# Patient Record
Sex: Female | Born: 1945
Health system: Southern US, Community
[De-identification: ages and names within clinical notes are randomized; demographics above are authoritative.]

## PROBLEM LIST (undated history)

## (undated) ENCOUNTER — Ambulatory Visit

## (undated) DIAGNOSIS — M1990 Unspecified osteoarthritis, unspecified site: Secondary | ICD-10-CM

## (undated) DIAGNOSIS — T4145XA Adverse effect of unspecified anesthetic, initial encounter: Secondary | ICD-10-CM

## (undated) DIAGNOSIS — N390 Urinary tract infection, site not specified: Secondary | ICD-10-CM

## (undated) DIAGNOSIS — D649 Anemia, unspecified: Secondary | ICD-10-CM

## (undated) DIAGNOSIS — K222 Esophageal obstruction: Secondary | ICD-10-CM

## (undated) DIAGNOSIS — I10 Essential (primary) hypertension: Secondary | ICD-10-CM

## (undated) DIAGNOSIS — G4730 Sleep apnea, unspecified: Secondary | ICD-10-CM

## (undated) DIAGNOSIS — F419 Anxiety disorder, unspecified: Secondary | ICD-10-CM

## (undated) DIAGNOSIS — T8859XA Other complications of anesthesia, initial encounter: Secondary | ICD-10-CM

## (undated) DIAGNOSIS — K219 Gastro-esophageal reflux disease without esophagitis: Secondary | ICD-10-CM

## (undated) DIAGNOSIS — R002 Palpitations: Secondary | ICD-10-CM

## (undated) HISTORY — PX: TUBAL LIGATION: SHX77

## (undated) HISTORY — DX: Anemia, unspecified: D64.9

## (undated) HISTORY — PX: DILATION AND CURETTAGE OF UTERUS: SHX78

## (undated) HISTORY — DX: Gastro-esophageal reflux disease without esophagitis: K21.9

## (undated) HISTORY — PX: OTHER SURGICAL HISTORY: SHX169

## (undated) HISTORY — PX: ESOPHAGOGASTRODUODENOSCOPY: SHX1529

## (undated) HISTORY — DX: Urinary tract infection, site not specified: N39.0

## (undated) HISTORY — DX: Essential (primary) hypertension: I10

## (undated) HISTORY — PX: CHOLECYSTECTOMY: SHX55

## (undated) HISTORY — DX: Esophageal obstruction: K22.2

---

## 1999-05-30 ENCOUNTER — Encounter: Payer: Self-pay | Admitting: Gastroenterology

## 1999-05-30 ENCOUNTER — Ambulatory Visit (HOSPITAL_COMMUNITY): Admission: RE | Admit: 1999-05-30 | Discharge: 1999-05-30 | Payer: Self-pay | Admitting: Gastroenterology

## 1999-07-11 ENCOUNTER — Other Ambulatory Visit: Admission: RE | Admit: 1999-07-11 | Discharge: 1999-07-11 | Payer: Self-pay | Admitting: *Deleted

## 1999-07-18 ENCOUNTER — Encounter: Admission: RE | Admit: 1999-07-18 | Discharge: 1999-10-16 | Payer: Self-pay | Admitting: Internal Medicine

## 1999-09-30 ENCOUNTER — Encounter: Payer: Self-pay | Admitting: Occupational Medicine

## 1999-09-30 ENCOUNTER — Ambulatory Visit: Admission: RE | Admit: 1999-09-30 | Discharge: 1999-09-30 | Payer: Self-pay | Admitting: Occupational Medicine

## 1999-10-14 ENCOUNTER — Emergency Department (HOSPITAL_COMMUNITY): Admission: EM | Admit: 1999-10-14 | Discharge: 1999-10-14 | Payer: Self-pay | Admitting: Emergency Medicine

## 1999-10-15 ENCOUNTER — Encounter: Payer: Self-pay | Admitting: Emergency Medicine

## 2000-03-11 ENCOUNTER — Emergency Department (HOSPITAL_COMMUNITY): Admission: EM | Admit: 2000-03-11 | Discharge: 2000-03-11 | Payer: Self-pay | Admitting: Emergency Medicine

## 2001-03-11 ENCOUNTER — Encounter (INDEPENDENT_AMBULATORY_CARE_PROVIDER_SITE_OTHER): Payer: Self-pay | Admitting: *Deleted

## 2001-03-11 ENCOUNTER — Other Ambulatory Visit: Admission: RE | Admit: 2001-03-11 | Discharge: 2001-03-11 | Payer: Self-pay | Admitting: *Deleted

## 2002-01-23 ENCOUNTER — Encounter: Admission: RE | Admit: 2002-01-23 | Discharge: 2002-01-23 | Payer: Self-pay | Admitting: Internal Medicine

## 2002-01-23 ENCOUNTER — Encounter: Payer: Self-pay | Admitting: Internal Medicine

## 2002-03-31 ENCOUNTER — Emergency Department (HOSPITAL_COMMUNITY): Admission: EM | Admit: 2002-03-31 | Discharge: 2002-03-31 | Payer: Self-pay | Admitting: Emergency Medicine

## 2002-04-24 ENCOUNTER — Other Ambulatory Visit: Admission: RE | Admit: 2002-04-24 | Discharge: 2002-04-24 | Payer: Self-pay | Admitting: *Deleted

## 2002-05-08 ENCOUNTER — Encounter: Admission: RE | Admit: 2002-05-08 | Discharge: 2002-08-06 | Payer: Self-pay | Admitting: Internal Medicine

## 2003-05-30 ENCOUNTER — Emergency Department (HOSPITAL_COMMUNITY): Admission: EM | Admit: 2003-05-30 | Discharge: 2003-05-30 | Payer: Self-pay | Admitting: Emergency Medicine

## 2003-05-30 ENCOUNTER — Encounter: Payer: Self-pay | Admitting: Emergency Medicine

## 2003-06-09 ENCOUNTER — Encounter: Payer: Self-pay | Admitting: Orthopedic Surgery

## 2003-06-11 ENCOUNTER — Inpatient Hospital Stay (HOSPITAL_COMMUNITY): Admission: RE | Admit: 2003-06-11 | Discharge: 2003-06-14 | Payer: Self-pay | Admitting: Orthopedic Surgery

## 2004-03-11 ENCOUNTER — Ambulatory Visit (HOSPITAL_BASED_OUTPATIENT_CLINIC_OR_DEPARTMENT_OTHER): Admission: RE | Admit: 2004-03-11 | Discharge: 2004-03-11 | Payer: Self-pay | Admitting: Pulmonary Disease

## 2004-05-19 ENCOUNTER — Other Ambulatory Visit: Admission: RE | Admit: 2004-05-19 | Discharge: 2004-05-19 | Payer: Self-pay | Admitting: *Deleted

## 2004-10-11 ENCOUNTER — Ambulatory Visit: Payer: Self-pay | Admitting: Gastroenterology

## 2004-11-15 ENCOUNTER — Ambulatory Visit: Payer: Self-pay | Admitting: Gastroenterology

## 2005-01-25 ENCOUNTER — Ambulatory Visit: Payer: Self-pay | Admitting: Internal Medicine

## 2005-04-10 ENCOUNTER — Ambulatory Visit: Payer: Self-pay | Admitting: Internal Medicine

## 2005-05-15 ENCOUNTER — Ambulatory Visit: Payer: Self-pay | Admitting: Internal Medicine

## 2005-05-22 ENCOUNTER — Ambulatory Visit: Payer: Self-pay | Admitting: Internal Medicine

## 2005-05-30 ENCOUNTER — Ambulatory Visit: Payer: Self-pay | Admitting: Pulmonary Disease

## 2006-07-26 ENCOUNTER — Other Ambulatory Visit: Admission: RE | Admit: 2006-07-26 | Discharge: 2006-07-26 | Payer: Self-pay | Admitting: *Deleted

## 2006-07-26 ENCOUNTER — Ambulatory Visit: Payer: Self-pay | Admitting: Internal Medicine

## 2006-08-13 ENCOUNTER — Ambulatory Visit: Payer: Self-pay | Admitting: Internal Medicine

## 2006-08-27 ENCOUNTER — Ambulatory Visit: Payer: Self-pay | Admitting: Internal Medicine

## 2006-12-05 ENCOUNTER — Ambulatory Visit: Payer: Self-pay | Admitting: Internal Medicine

## 2006-12-05 ENCOUNTER — Inpatient Hospital Stay (HOSPITAL_COMMUNITY): Admission: EM | Admit: 2006-12-05 | Discharge: 2006-12-08 | Payer: Self-pay | Admitting: Emergency Medicine

## 2006-12-08 ENCOUNTER — Ambulatory Visit: Payer: Self-pay | Admitting: Internal Medicine

## 2006-12-26 ENCOUNTER — Ambulatory Visit: Payer: Self-pay | Admitting: Internal Medicine

## 2007-05-31 ENCOUNTER — Encounter: Admission: RE | Admit: 2007-05-31 | Discharge: 2007-05-31 | Payer: Self-pay | Admitting: Gastroenterology

## 2007-06-08 ENCOUNTER — Observation Stay (HOSPITAL_COMMUNITY): Admission: EM | Admit: 2007-06-08 | Discharge: 2007-06-09 | Payer: Self-pay | Admitting: Emergency Medicine

## 2007-06-08 ENCOUNTER — Ambulatory Visit: Payer: Self-pay | Admitting: Internal Medicine

## 2007-11-18 ENCOUNTER — Telehealth: Payer: Self-pay | Admitting: Internal Medicine

## 2008-09-18 ENCOUNTER — Telehealth: Payer: Self-pay | Admitting: Internal Medicine

## 2008-10-05 ENCOUNTER — Telehealth: Payer: Self-pay | Admitting: Internal Medicine

## 2008-10-26 ENCOUNTER — Telehealth: Payer: Self-pay | Admitting: Internal Medicine

## 2009-03-01 ENCOUNTER — Other Ambulatory Visit: Admission: RE | Admit: 2009-03-01 | Discharge: 2009-03-01 | Payer: Self-pay | Admitting: Obstetrics and Gynecology

## 2010-05-04 ENCOUNTER — Other Ambulatory Visit: Admission: RE | Admit: 2010-05-04 | Discharge: 2010-05-04 | Payer: Self-pay | Admitting: Obstetrics and Gynecology

## 2010-09-22 ENCOUNTER — Ambulatory Visit (HOSPITAL_COMMUNITY): Admission: RE | Admit: 2010-09-22 | Discharge: 2010-09-22 | Payer: Self-pay | Admitting: Gastroenterology

## 2010-10-04 ENCOUNTER — Ambulatory Visit (HOSPITAL_COMMUNITY)
Admission: RE | Admit: 2010-10-04 | Discharge: 2010-10-04 | Payer: Self-pay | Source: Home / Self Care | Attending: Gastroenterology | Admitting: Gastroenterology

## 2010-11-13 ENCOUNTER — Encounter: Payer: Self-pay | Admitting: Gastroenterology

## 2011-03-07 NOTE — H&P (Signed)
[name], [name] NO.:  [id number]   MEDICAL RECORD NO.:  [id number]          PATIENT TYPE:  EMS   LOCATION:  MAJO                         FACILITY:  [facility]   PHYSICIAN:  Corwin Levins, MD      DATE OF BIRTH:  Jul 07, 1946   DATE OF ADMISSION:  06/08/2007  DATE OF DISCHARGE:                              HISTORY & PHYSICAL   CHIEF COMPLAINT:  Since 11:00 p.m. August 15 with headache, nausea,  vomiting, abdominal pains and diarrhea.   HISTORY OF PRESENT ILLNESS:  [name] is a 65 year old African-  American female here with the above symptoms now prostrate and weak and  quite nervous; unable to go home after treatment in the ER with Zofran,  Fentanyl and IV fluids.  She is not particularly dizzy, there are no  falls or loss of consciousness.  She denies any blood either hematemesis  or bright red blood per rectum.  There had been multiple chills.  She  denies any GU symptoms.  Her main pain has to do with the headache and  epigastric discomfort.  This is all somewhat similar to previous  admission February of 2008 where she was treated symptomatically and no  definite diagnosis made.  Upper GI series May 31, 2007 with reflux  only, exam otherwise negative.   PAST MEDICAL HISTORY/ILLNESSES:  1. Syncope.  2. History of gastroparesis.  3. Diabetes mellitus.  4. GERD.  5. Morbid obesity.  6. Diverticulosis.  7. Depression/anxiety.  8. Status post right ACL repair.  9. Status post C-section.  10.History of D&C.  11.History of hernia repair.  12.Status post cholecystectomy.   ALLERGIES:  No known drug allergies.   CURRENT MEDICATIONS:  1. Prevacid 30 mg p.o. q.day.  2. Multivitamin one p.o. q.day.  3. Prozac 20 mg p.o. q.day.  4. Januvia 50 mg p.o. q.day.  5. Toprol XL, unknown dose, one q.day.   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   FAMILY  HISTORY:  Diabetes mellitus.   PHYSICAL EXAMINATION:  VITAL SIGNS:  Temperature 99.5, blood pressure  148/99, heart rate 101, respirations 26, O2 saturation 99%.  GENERAL:  She is prostrate, markedly anxious complaining of abdominal  discomfort and headache.  HEENT EXAM:  Sclerae clear.  TMs clear.  Oropharynx benign.  NECK:  Without lymphadenopathy, JVD or thyromegaly.  CHEST:  No rales or wheezing.  CARDIAC EXAM:  Regular rate and rhythm.  No murmur.  ABDOMEN:  Soft.  Epigastric tenderness moderate.  No guarding.  No  rebound.  EXTREMITIES:  No edema.   LABS:  C-Met within normal limits.  White blood cell count 8.6,  hemoglobin 12.9.   ASSESSMENT/PLAN:  1. Headache with fever, nausea, vomiting, epigastric pain and loose      stools.  Differential includes food poisoning versus viral      gastroenteritis versus other etiologies that seem less likely such      as gastroparesis flare, pancreatitis, bacterial colitis versus      other.  She is to be admitted.  Will check a lipase, urinalysis,      culture and sensitivity, and portable chest x-ray for completeness.      She will start with clear liquids and IV fluids, as well as symptom      control with Zofran and Dilaudid IV.  Check stool studies, guaiac      stools.  Trend the vitals.  Continue with a proton pump inhibitor      in the IV form.  2. Diabetes mellitus.  Check CBGs, sliding-scale insulin, hold the      oral hypoglycemic agent.  3. Hypertension.  Try to give Toprol XL p.o., dose not clear, try 50      mg.  4. Anxiety.  Rather significant component and will give lorazepam IV      p.r.n.  5. Full code status.  6. Prophylaxis.  Give Lovenox subcutaneously q.day.  7. Disposition for home when improved.      Corwin Levins, MD  Electronically Signed     JWJ/MEDQ  D:  06/08/2007  T:  06/08/2007  Job:  [id number]   cc:   Deirdre Peer. Polite, M.D.  [facility]

## 2011-03-10 NOTE — Discharge Summary (Signed)
[name], [name]              ACCOUNT NO.:  [id number]   MEDICAL RECORD NO.:  [id number]          PATIENT TYPE:  INP   LOCATION:  5148                         FACILITY:  [facility]   PHYSICIAN:  Georgann Housekeeper, MD      DATE OF BIRTH:  August 29, 1946   DATE OF ADMISSION:  06/08/2007  DATE OF DISCHARGE:  06/09/2007                               DISCHARGE SUMMARY   DISCHARGE DIAGNOSES:  1. Acute gastroenteritis.  2. Nausea and vomiting, secondary to gastroenteritis.  3. Mild hypokalemia.  4. History of diabetes.  5. History of hypertension.   DISCHARGE MEDICATIONS:  1. Januvia 100 mg p.o. daily.  2. Prevacid 30 mg daily.  3. Prozac 20 mg daily.  4. Phenergan p.r.n. for nausea.   DISCHARGE INSTRUCTIONS:  Bland diet and follow with Dr. Renford Dills in  one week.   HOSPITAL COURSE:  The patient is 65 years old with a history of acid  reflux disease and diabetes, morbid obesity and diverticulosis and  depression who came in with nausea and vomiting, epigastric pain.  The  patient was admitted.  Her lab data all negative, lipase and blood  chemistries were okay, mild hypokalemia.  She had had gallbladder  surgery in the past.  Was put on IV fluids and clear liquid diet and  patient tolerated that well.  No evidence of any nausea and vomiting.  Her sugars remained stable.  She had mild hypokalemia and potassium was  replaced.  Her renal function and white count were normal.  Stool  studies were sent which came back negative for C. diff.  The patient had  no more diarrhea.  Diet was advanced to liquids and tolerated that well.  The patient was discharged with bland diet instructions and follow-up  with Dr. Renford Dills in one week.      Georgann Housekeeper, MD  Electronically Signed     KH/MEDQ  D:  07/29/2007  T:  07/29/2007  Job:  [id number]

## 2011-03-10 NOTE — H&P (Signed)
[name], [name]              ACCOUNT NO.:  [id number]   MEDICAL RECORD NO.:  [id number]          PATIENT TYPE:  INP   LOCATION:  1430                         FACILITY:  [facility]   PHYSICIAN:  Georgina Quint. Plotnikov, MDDATE OF BIRTH:  [birth year]   DATE OF ADMISSION:  12/05/2006  DATE OF DISCHARGE:                              HISTORY & PHYSICAL   CHIEF COMPLAINT:  Fever, weakness.   HISTORY OF PRESENT ILLNESS:  The patient is a 65 year old female who has  been sick with flu-like symptoms for the past two days.  Presented to  the office today with her husband with sore throat, fever, chills and  weakness.  She was also nauseated starting this morning.  When I walked  in, she was sitting on the chair, slumped over, staring at the wall.  She was very slow to respond to questions.  Her color was somewhat  ashen.  Myself and two nurses got her up and moved her to the exam table  with great difficulty.  She has some difficulty following commands,  although answered questions appropriately.  She was laid down and the  ambulance was called to transfer to [facility] ER.   PAST MEDICAL HISTORY:  1. Gastroparesis.  2. Diabetes type 2.  3. GERD.  4. Morbid obesity.  5. Diverticulosis.  6. Hypertension.  7. Depression.   CURRENT MEDICATIONS:  Unable to obtain from the patient at present.   ALLERGIES:  No known drug allergies.   SOCIAL HISTORY:  She is a nonsmoker, nondrinker.  Husband is with her.   FAMILY HISTORY:  Positive for diabetes.   REVIEW OF SYSTEMS:  Unobtainable from the patient.  The husband is  describing general flu-like illness.  Also states now she looks much  worse than she did a few minutes ago.  The rest of the 10-point review  of systems is unobtainable.   PHYSICAL EXAMINATION:  Blood pressure 123/74, pulse 96, respirations  103, temperature 102.6, weight 218 pounds.  O2 saturation 96%.  Blood  sugar 129.  She is in moderate acute distress.  HEENT with  erythematous  mucosa.  Throat with erythema.  Lungs clear.  No wheezes.  Heart is S1-  S2.  Distant tones, tachycardia.  Abdomen is soft and nontender.  No  organomegaly, no mass felt.  Lower extremities without edema.  She is  alert, cooperative.  Meningeal signs are negative.  Calves are  nontender.  Cranial nerves II through XII grossly nonfocal.  Balance is  disturbed.  Skin without rashes, ashen.   ASSESSMENT/PLAN:  1. Near syncopal episode, partially unresponsive on exam:  Most likely      this spell is vasovagal; however, we need to rule out cardiac and      other causes.  She is obviously quite sick with febrile illness.      We will treat with IV fluids, transfer to [facility] via      ambulance.  2. Upper respiratory tract infection, possible influenza:  Obtain a      rapid flu test.  We will use Tamiflu is positive.  Otherwise, we      will use empiric Rocephin.  3. Headache without meningeal signs:  It would be suspicious obtain a      CT of the head without contrast to rule out bleeding and other      causes.  We will need a chest x-ray.  4. Fever:  Obtain blood cultures, flu test (she has had a flu      vaccination this season).  5. Chronic gastroparesis:  We will keep n.p.o. for now.  6. Type 2 diabetes:  We will use sliding scale.      Georgina Quint. Plotnikov, MD  Electronically Signed     AVP/MEDQ  D:  12/05/2006  T:  12/06/2006  Job:  [id number]

## 2011-03-10 NOTE — Discharge Summary (Signed)
NAME:  [name], [name]                        ACCOUNT NO.:  [id number]   MEDICAL RECORD NO.:  [id number]                   PATIENT TYPE:  INP   LOCATION:  0468                                 FACILITY:  [facility]   PHYSICIAN:  Vania Rea. Supple, M.D.               DATE OF BIRTH:  [birth year]   DATE OF ADMISSION:  06/11/2003  DATE OF DISCHARGE:  06/14/2003                                 DISCHARGE SUMMARY   ADMISSION DIAGNOSES:  1. Status post right knee dislocation with multi ligamentous injury and     peroneal nerve palsy.  2. Hypertension.  3. Anxiety.  4. Non-insulin-dependent diabetes.  5. Gastroesophageal reflux disease.   DISCHARGE DIAGNOSES:  1. Status post right knee dislocation with multi ligamentous injury and     peroneal nerve palsy.  2. Status post Allograft ACL reconstruction with open lateral collateral     ligament, posterolateral corner repair, lateral meniscal repair with     exploration, neurolysis of the peroneal nerve.   OPERATION:  Surgeon Vania Rea. Supple, M.D.  Assistant French Ana A. Shuford, P.A.-  C.  Under general anesthesia.   BRIEF HISTORY:  [name] is a 65 year old female who was running trying to  get away from a dog.  Sustained a significant knee injury with a dislocation  noted.  She was seen and evaluated on an outpatient basis.  An MRI was  obtained which did confirm multi ligamentous injury with ACL rupture.  The  patient had gross instability on examination.  She was found to have a  complete peroneal nerve palsy also on examination.  With these findings it  is felt that she should undergo surgical fixation as above.  Risks, benefits  discussed with patient and she is in agreement with procedure.   HOSPITAL COURSE:  The patient is admitted.  Underwent above named procedure  and tolerated this well.  All appropriate IV antibiotics, analgesics were  utilized.  Postoperatively, patient was placed partial weightbearing in a  knee immobilizer at 50%  with no range of motion initially.  She was placed  on AFOs to prevent Achilles contractures.  All appropriate IV antibiotics,  analgesics were utilized.  She was placed on Coumadin for DVT and PE  prophylaxis due to body habitus and decrease of activity.  All in all  patient did well postoperatively.  Home needs were arranged.  She progressed  well, tolerating short distance ambulation and was doing well with  transfers.  She was maintained on just p.o.'s for pain control.  By date  June 14, 2003 she was felt orthopedically and medically stable to be  discharged to home.  Arrangements were made and she was discharged home to  follow up on an outpatient basis.   LABORATORY DATA:  Admission hemoglobin 11.9, postoperatively 10.  Multiple  pro times found in the chart.  Followed by pharmacy on Coumadin.  Chemistries on admission within normal  limits.  Postoperatively normal also  except for 127 and 123 glucose.  Urinalysis was negative on admission.  EKG  showed normal sinus rhythm.  Chest x-ray showed scoliosis, but no active  disease.   CONDITION ON DISCHARGE:  Stable, improved.   DISCHARGE MEDICATIONS AND PLANS:  The patient will be discharged to home.  She will follow up two weeks.  Call for time.  Home health RN, PT, OT, and  aide are arranged.  She is allowed 50% partial weightbearing  with crutches and her knee immobilizer on.  May shower at five days  postoperatively.  No range of motion to the knee at this time.  Prescription  is provided for Percocet one to two q.4-6h. p.r.n. pain and Coumadin.  Resume home medications, home diet.     Tracy A. Shuford, P.A.-C.                 Vania Rea. Supple, M.D.    TAS/MEDQ  D:  07/09/2003  T:  07/09/2003  Job:  [id number]

## 2011-03-10 NOTE — H&P (Signed)
NAME:  [name], [name]                        ACCOUNT NO.:  [id number]   MEDICAL RECORD NO.:  [id number]                   PATIENT TYPE:  INP   LOCATION:  0468                                 FACILITY:  [facility]   PHYSICIAN:  Vania Rea. Supple, M.D.               DATE OF BIRTH:  09/27/1946   DATE OF ADMISSION:  06/11/2003  DATE OF DISCHARGE:  06/14/2003                                HISTORY & PHYSICAL   CHIEF COMPLAINT:  Right knee pain.   HISTORY OF PRESENT ILLNESS:  [name] is a 65 year old female who was  trying to run to get away from a dog sustaining a right knee dislocation.  She was initially seen and evaluated outpatient and was found to have  multiple ligamentous injuries.  She was sent for MRI.  She was also found to  have a peroneal nerve palsy post dislocation.  MRI demonstrated rupture of  the ACL and complete rupture of the posterolateral corner, lateral  collateral ligament, and biceps femoris.  She was felt to require admission  for operative fixation of above along with decompression of the peroneal  nerve.  Risks/benefits discussed with patient at length and she wished to  proceed.   PAST MEDICAL HISTORY:  1. Hypertension.  2. History of diverticulitis.  3. Non-insulin-dependent diabetes.  4. Reflux.   PAST SURGICAL HISTORY:  1. D&C.  2. Cholecystectomy.  3. Hernia repair as a child.  4. Cesarean section.   CURRENT MEDICATIONS:  1. Aciphex.  2. Reglan.  3. Actos.  4. Toprol.  5. Bextra.  6. Methocarbamol.  7. Percocet.   ALLERGIES:  No known drug allergies.   SOCIAL HISTORY:  She lives at home with family.  She quit smoking  approximately eight years ago.  She does not drink.   REVIEW OF SYSTEMS:  CONSTITUTIONAL:  Denies any recent fevers, chills, night  sweats.  No bleeding tendencies.  CNS:  No blurred/double vision, seizure,  paralysis.  RESPIRATORY:  No shortness of breath, productive cough,  hemoptysis.  CARDIOVASCULAR:  No chest pain,  angina, orthopnea.  GASTROINTESTINAL:  No nausea, vomiting, constipation, melena, bloody stools.  GENITOURINARY:  No dysuria.  MUSCULOSKELETAL:  As pertinent to present  illness.   FAMILY HISTORY:  Noncontributory.   PHYSICAL EXAMINATION:  GENERAL:  A 65 year old African-American female in  mild discomfort secondary to right knee pain.  Alert and oriented.  NECK:  Supple.  CHEST:  Clear to auscultation bilaterally.  HEART:  Regular rate and rhythm.  ABDOMEN:  Soft, nontender.  EXTREMITIES:  Right knee with gross instability on examination.  Neurovascularly she is intact except for complete peroneal nerve palsy.   IMPRESSION:  1. Status post right knee dislocation with multi ligamentous injury.  2. Peroneal nerve palsy.   PLAN:  Admit for fixation of above.  Risks/benefits discussed with patient.  She wished to proceed.     Tracy A. Shuford, P.A.-C.  Vania Rea. Supple, M.D.    TAS/MEDQ  D:  07/09/2003  T:  07/09/2003  Job:  [id number]

## 2011-03-10 NOTE — Discharge Summary (Signed)
[name], [name]              ACCOUNT NO.:  [id number]   MEDICAL RECORD NO.:  [id number]          PATIENT TYPE:  INP   LOCATION:  1430                         FACILITY:  [facility]   PHYSICIAN:  Rosalyn Gess. Norins, MD  DATE OF BIRTH:  Oct 18, 1946   DATE OF ADMISSION:  12/05/2006  DATE OF DISCHARGE:  12/08/2006                               DISCHARGE SUMMARY   ADMITTING DIAGNOSES:  1. Syncope.  2. Upper respiratory infection.  3. Headache  4. Gastroparesis.  5. Type 2 diabetes.   DISCHARGE DIAGNOSES:  1. Syncope.  2. Upper respiratory infection.  3. Headache  4. Gastroparesis.  5. Type 2 diabetes.   CONSULTANTS:  None.   PROCEDURES:  1. CT scan of the brain without contrast with no acute intracranial      abnormality.  2. Chest x-ray with no active cardiopulmonary disease.   HISTORY OF PRESENT ILLNESS:  The patient is a 65 year old woman who had  been sick with flu-like symptoms for 2 days prior to admission.  She  presented to the office on the day of admission complaining of a sore  throat, fever, chills and weakness.  She also had nausea.  She was found  in the exam room slumped over, staring at the wall, very slow to respond  with ashen color.  She was difficult to remove and arouse.  It was felt  she had had a near syncopal episode with possible hypoxemia.  She was  transported to [facility].   Please see the admission note for past medical history, family history,  and social history.   Admission exam was significant for temperature of 102.6, respirations  were 103, pulse was 96, O2 saturation was 96%, blood sugar was 129.  The  patient was in moderate distress.  She had grayish mucosa, throat with erythema.  ABDOMEN:  Was soft.  LUNGS:  Were clear.  NEUROLOGIC EXAM:  Was nonfocal.   HOSPITAL COURSE:  The patient was admitted to the hospital.  Diagnostic  evaluation was conducted with CT scan and chest x-ray as noted.  Blood  cultures  were drawn and were negative x2.  Troponin I was negative x2.  The patient was noted to have a normal white count at 4800.  Potassium  was slightly low at 3.3.  O2 sat was 97% on room air.  The patient was  initially treated with IV antibiotics but was converted to p.o.  antibiotics on the 15th.  She continued to improve.  O2 saturations were  adequate.  The patient felt weak but in no acute distress.  With the  patient's symptoms having stabilized with her O2 saturation being normal  with her temperature returning to normal, she was felt to be stable and  ready for discharge home to continue oral antibiotics.   DISCHARGE EXAMINATION:  Temperature 99.4, blood pressure 130/91, pulse  68, respirations were 18, O2 saturations 96% on room air.  Last CBG was  141.  GENERAL APPEARANCE:  This is an obese African-American woman sitting in  bed in no acute distress.  CHEST:  Patient is moving  air well.  She had no wheezing.  CARDIOVASCULAR EXAM:  Was unremarkable.  No further examination  conducted.   FINAL LABORATORY:  CBC from February 15 with a hemoglobin of 12.4 grams,  white count was 3600, platelet count 143,000.  The patient had  differential with 83% segs, 8% lymphs, 9% monocytes.  Final chemistries  from February 15 were normal with a potassium of 3.9, creatinine 0.6,  glucose was 121.  Cardiac enzymes were negative x3 at 98, 86 and 94 with  troponin-I being negative x3 at 0.02, 0.02 and 0.02.  Calcium was normal  at 8.6.  Urinalysis was negative.  Influenza nasal swab was negative.   DISCHARGE MEDICATIONS:  The patient will resume her home medications  including:  1. Aciphex 20 mg daily.  2. Multivitamins.  3. Prozac 20 mg daily.  4. Januvia 50 mg daily.  5. Toprol XL as at home.  6. The patient will be continued on the azithromycin 500 mg p.o. daily      for an additional 2 days.  7. She will use Mucinex 2 tablets a.m. and 2 tablets p.m.   The patient may return to work on  Tuesday, February 19.  She should  follow up with Dr. Posey Rea towards the end of next week, and she will  call the office on Monday for an appointment.   The patient's condition at time of discharge dictation is stable and  improved.      Rosalyn Gess Norins, MD  Electronically Signed     MEN/MEDQ  D:  12/08/2006  T:  12/08/2006  Job:  [id number]   cc:   Georgina Quint. Plotnikov, MD  [address]  [place]  [place] [address]

## 2011-03-10 NOTE — Op Note (Signed)
NAME:  [name], [name]                        ACCOUNT NO.:  [id number]   MEDICAL RECORD NO.:  [id number]                   PATIENT TYPE:  INP   LOCATION:  X001                                 FACILITY:  [facility]   PHYSICIAN:  Vania Rea. Supple, M.D.               DATE OF BIRTH:  04-23-1946   DATE OF PROCEDURE:  06/11/2003  DATE OF DISCHARGE:                                 OPERATIVE REPORT   PREOPERATIVE DIAGNOSES:  1. Right knee dislocation with multiple ligamentous injury including 1)     right knee ACL rupture, 2) right knee lateral collateral ligament     avulsion, 3) right knee biceps femoris avulsion.  2. Perineal nerve palsy.   POSTOPERATIVE DIAGNOSES:  1. Right knee dislocation with multiple ligamentous injury including 1)     right knee ACL rupture, 2) right knee lateral collateral ligament     avulsion, 3) right knee biceps femoris avulsion.  2. Perineal nerve palsy.   PROCEDURE:  1. Right knee examination under anesthesia.  2. Right knee diagnostic arthroscopy.  3. Allograft ACL reconstruction.  4. Repair of the lateral collateral ligament.  5. Repair of the avulsed biceps femoris tendon.  6. Repair of the posterolateral corner.  7. Repair of the lateral meniscus.  8. Exploration and neurolysis of the peroneal nerve.   SURGEON:  Vania Rea. Supple, M.D.   Threasa HeadsFrench Ana A. Pacheco, P.A.-C.   ANESTHESIA:  General endotracheal.   TOURNIQUET TIME:  2 hours and 10 minutes.   ESTIMATED BLOOD LOSS:  Less than 100 mL.   DRAINS:  None.   HISTORY:  [name] is a 65 year old female who fell sustaining a low  velocity injury to her right knee with apparent severe varus deformity. She  had subsequent immediate pain, swelling, instability and inability to bear  weight. Her examination in the office showed gross varus laxity with  tenderness diffusely over the posterolateral aspect of the knee. Plain  radiographs had been obtained on the day of injury showing an  avulsion of  the fibular head with significant proximal migration consistent with an  injury to the lateral and posterolateral structures. Subsequently an MRI  scan was performed confirming a rupture of the ACL and complete rupture of  the posterolateral corner, lateral collateral ligament and the biceps  femoris. In addition, clinically she was found to have a complete peroneal  nerve palsy.   I had counseled [name] and her family members on treatment options as  well as risks versus benefits thereof. Possible surgical complications of  bleeding, infection, neurovascular injury, persistent pain, loss of motion,  recurrence of instability, posttraumatic arthritis, and possible need for  additional surgery were all reviewed. In consideration of her complex and  severe ligamentous injury, I had recommended ligamentous reconstruction as  described below and she understands and accepts and agrees with our plan.   DESCRIPTION OF PROCEDURE:  After undergoing  routine preoperative evaluation,  the patient received prophylactic antibiotics. Placed supine on the  operating table, underwent smooth induction of general endotracheal  anesthesia. A tourniquet was applied to the right thigh and right leg was  sterilely prepped and draped in standard fashion. Examination under  anesthesia confirmed obvious lateral and posterolateral instability with a  positive recurvatum external rotation sign. The excision was sterilely  prepped and draped in standard fashion. At this point on the back table, we  appropriately thawed and then contoured a patellar tendon bone allograft and  fashioned it to fit through a 10 mm bone tunnel with Ethibond sutures placed  through the bone plugs proximally and distally. 10 x 25 mm bone plugs were  obtained. The right lower extremity was then exsanguinated with a tourniquet  inflated to 400 mmHg. Standard arthroscopy portals were established and  diagnostic arthroscopy  was performed. Large hemarthrosis was evacuated. The  gutters were clear. Patellofemoral joint showed the articular surfaces to  show some mild diffuse chondromalacia but no significant fissuring or  chondral fragments. There was normal patella tracking. Extensive synovectomy  was performed. Remnants of the ACL were identified and removed with the  shaver. The medial compartment showed the meniscus to be stable and the  articular surfaces were in generally good condition medially. Laterally  there was a positive drive through sign.  The meniscus itself showed  detachment from the meniscocapsular junction and this meniscal detachment  was subsequently addressed with an open technique. The articular surfaces  otherwise looked to be in good condition. Attention was turned to the  intercondylar notch where an osteotome was then used to perform an  notchplasty and the bone fragments were removed with the pituitary rongeur.  A bur was then used to complete the notchplasty back to the over the top  position. All residual debris and soft tissue in the intercondylar notch was  removed with the shaver. A tibial guide was then introduced and a 4 cm  longitudinal incision was made just medial to the tibial tubercle with  dissection carried down to the medial tibial metaphysis. The starting point  for the tibial guidepin was then identified and using the tibial guide, a  guidepin was brought up through the footprint of the ACL. This was then over  drilled with a 10 mm reamer. The femoral guide was then passed through the  tibial tunnel and at the 11 o'clock position over the top position in the  intercondylar notch, a femoral guidepin was then placed. This was also  drilled with a 10 mm reamer and care was taken to make sure that there was  an appropriate posterior wall thickness. The guidepin was then removed. The joint was copiously irrigated and all bone debris was removed. A two pin  passer was then  used to pass a flexible guidewire up through the femoral  tunnel. The two pin passer was then used to pass the graft into the joint.  The graft was then pulled into position obtaining excellent bony  interference fit both proximally and distally. An 8 x 25 mm metal  interference screw was then passed over the flexible guidewire obtaining  good purchase in the femoral tunnel. The knee was taken then through a range  of motion showing physiometric positioning of the graft and no evidence for  impingement in full extension. At this point, we completed the arthroscopic  evaluation of the joint and turned our attention laterally. We made a 20 cm  lateral  hockey stick incision centered over the lateral femoral epicondyle  and extending proximally and distally. Skin flaps were then elevated and  dissection was carried down to the deep fascia with skin flaps mobilized.  There was obvious disruption of the lateral structures of the knee at the  level of the fibular head. The interval between the vastus lateralis and  biceps femoris was then developed proximally and this allowed visualization  of the avulsed lateral collateral ligament and biceps femoris tendon. We  then developed the interval just posterior to the biceps femoris and this  allowed visualization of the perineal nerve. It was carefully identified  both proximally and distally and was decompressed as it wrapped around the  fibular neck and then dissection carried proximally and it was freed of all  adhesions and attachments at the level of the posterolateral aspect of the  knee. The nerve itself did appear to be contused but I did not see any  obvious hourglass defects in the tendon. It was carefully mobilized and a  Vessiloop was placed around it and was retracted posteriorly throughout the  remainder of the case. Our attention was then redirected to the lateral and  posterolateral aspect of the knee where the lateral structures were  all  identified. There was evidence for complete avulsion of the meniscotibial  ligament laterally and posteriorly. The popliteus was identified and did  appear to maintain its femoral attachment although there was some laxity  with the tendinous portion noted. We placed a series of parallel modified  crack Krackow sutures with #2 fiber wire in the distal aspect of the lateral  collateral ligament as well as the distal portion of the biceps femoris. We  then placed a series of Arthrex Bio-FASTak suture anchors along the lateral  margin of the tibia just below the osteoarticular junction. These also were  with #2 fiber wire. This was then used to repair and recreate the  meniscotibial ligament. Posteriorly the meniscocapsular junction was  repaired utilizing 2-0 Vicryl figure-of-eight sutures and in addition this  allowed closure of the posterolateral capsular attachments to the proximal tibia. All of the avulsions appeared to have occurred off of the proximal  tibia. Once this was completed, we then let down the tourniquet. Hemostasis  was obtained. We then repaired the lateral collateral ligament and the  biceps femoris tendons with a small fleck of bone associated with them back  to the defect on the fibular head through bone tunnels in the proximal  fibula. This allowed excellent reapproximation and closure of the  ligamentous and tendinous attachments. We then over sewed the posterolateral  capsule and in addition over sewed the popliteus to add additional tension  to this insertion on the lateral femoral condyle. The longitudinal split we  had made in the iliotibial band dividing it from the biceps femoris was then  reapproximated with interrupted figure-of-eight #1 Vicryl sutures.  Additional over sewing of the posterolateral structures was then completed  with #1 Vicryl. The distal insertion of the IT band to Gerdy's tubercle was  repaired utilizing #1 Vicryl sutures as well. I  should mention that we  removed the Vessiloop from around the peroneal nerve prior to the office  closure of the deep fascia and confirmed that the peroneal nerve was  completely free and unencumbered. Once the deep fascia repair had been  completed, we then returned our attention to the knee joint itself.  Arthroscopic evaluation was reestablished. Under direct visualization, we  appropriately tensioned  the graft and placed a 9 x 25 mm metal interference  screw to the femoral tunnel over a guidewire. Excellent bleeding  interference fit was performed. At this point, direct inspection showed  negative intraoperative Lachman and good tension of the graft. The graft  showed no evidence for impingement in full extension. Fluid and the  instruments were removed from the knee joint itself. We then proceeded with  closure of the lateral incision with #1 Vicryl for the deep subcu layer, 2-0  Vicryl for the superficial subcu layer and staples were used to close the  skin. The arthroscopy portals and the anteromedial incision were closed with  staples and 2-0 Vicryl was used for the subcutaneous layer on the  anteromedial incision. Adaptic and a dry dressing was then wrapped about the  knee. The knee was wrapped with cast padding and then an Ace bandage from  foot to thigh. A knee immobilizer and ice packs were then applied. The  patient was then extubated and taken to the recovery room in stable  condition.                                               Vania Rea. Supple, M.D.    KMS/MEDQ  D:  06/11/2003  T:  06/11/2003  Job:  [id number]

## 2011-06-15 ENCOUNTER — Ambulatory Visit (HOSPITAL_COMMUNITY)
Admission: RE | Admit: 2011-06-15 | Discharge: 2011-06-15 | Disposition: A | Discharge status: Home / Self Care | Payer: BC Managed Care – PPO | Source: Ambulatory Visit | Attending: Gastroenterology | Admitting: Gastroenterology

## 2011-06-15 DIAGNOSIS — Z800 Family history of malignant neoplasm of digestive organs: Secondary | ICD-10-CM | POA: Insufficient documentation

## 2011-06-15 DIAGNOSIS — E119 Type 2 diabetes mellitus without complications: Secondary | ICD-10-CM | POA: Insufficient documentation

## 2011-06-15 DIAGNOSIS — I10 Essential (primary) hypertension: Secondary | ICD-10-CM | POA: Insufficient documentation

## 2011-06-15 DIAGNOSIS — K5730 Diverticulosis of large intestine without perforation or abscess without bleeding: Secondary | ICD-10-CM | POA: Insufficient documentation

## 2011-06-15 DIAGNOSIS — Z01812 Encounter for preprocedural laboratory examination: Secondary | ICD-10-CM | POA: Insufficient documentation

## 2011-06-15 DIAGNOSIS — Z79899 Other long term (current) drug therapy: Secondary | ICD-10-CM | POA: Insufficient documentation

## 2011-06-15 DIAGNOSIS — Z1211 Encounter for screening for malignant neoplasm of colon: Secondary | ICD-10-CM | POA: Insufficient documentation

## 2011-06-16 NOTE — Op Note (Signed)
  NAMEANALIAH, DRUM NO.:  [id number]  MEDICAL RECORD NO.:  [id number]  LOCATION:  WLEN                         FACILITY:  [facility]  PHYSICIAN:  Danise Edge, M.D.   DATE OF BIRTH:  August 17, 1946  DATE OF PROCEDURE:  06/15/2011 DATE OF DISCHARGE:                              OPERATIVE REPORT   REFERRING PHYSICIAN:  Deirdre Peer. Polite, MD  PROCEDURE:  Screening colonoscopy.  HISTORY:  Ms. [name] is a 65 year old female born on 1946-09-16.  The patient's father was diagnosed with colon cancer in his 42s. The patient is scheduled to undergo a screening colonoscopy with polypectomy to prevent colon cancer.  ENDOSCOPIST:  Danise Edge, MD  PREMEDICATION:  Fentanyl 100 mcg, Versed 7.5 mg.  PROCEDURE IN DETAIL:  The patient was placed in the left lateral decubitus position.  Anal inspection and digital rectal exam were normal.  The Pentax pediatric colonoscope was introduced into the rectum and easily advanced to the cecum.  Normal-appearing ileocecal valve and appendiceal orifice were identified.  Colonic preparation for the exam today was good.  Rectum normal.  Retroflex view of the distal rectum normal. Sigmoid colon and descending colon.  Left colonic diverticulosis. Splenic flexure normal. Transverse colon normal. Hepatic flexure normal. Ascending colon normal. Cecum and ileocecal valve normal.  ASSESSMENT:  Normal screening proctocolonoscopy to the cecum.  Left colonic diverticulosis present.  RECOMMENDATIONS:  Repeat screening colonoscopy in 10 years.          ______________________________ Danise Edge, M.D.     MJ/MEDQ  D:  06/15/2011  T:  06/15/2011  Job:  [id number]  cc:   Deirdre Peer. Polite, M.D. Fax: [phone]  Electronically Signed by Danise Edge M.D. on 06/16/2011 04:04:01 PM

## 2011-08-04 LAB — COMPREHENSIVE METABOLIC PANEL
Albumin: 3.8
Chloride: 105
GFR calc Af Amer: >60
GFR calc non Af Amer: >60
Glucose, Bld: 185 — ABNORMAL HIGH
Potassium: 3.5
Sodium: 141
Total Bilirubin: 0.8

## 2011-08-04 LAB — DIFFERENTIAL
Basophils Absolute: 0
Eosinophils Absolute: 0.1
Eosinophils Relative: 1
Lymphocytes Relative: 10 — ABNORMAL LOW
Monocytes Relative: 3
Neutrophils Relative %: 85 — ABNORMAL HIGH

## 2011-08-04 LAB — BASIC METABOLIC PANEL
BUN: 8
CO2: 27
Chloride: 107
GFR calc Af Amer: >60
Sodium: 139

## 2011-08-04 LAB — CBC
HCT: 37.4
Hemoglobin: 12.4
Hemoglobin: 12.9
MCHC: 34.4
MCV: 83.1
MCV: 85.4
Platelets: 175
RBC: 4.5
WBC: 4.7

## 2011-08-04 LAB — GIARDIA/CRYPTOSPORIDIUM SCREEN(EIA)

## 2011-08-04 LAB — STOOL CULTURE

## 2011-08-04 LAB — URINE CULTURE: Colony Count: 60000

## 2011-08-04 LAB — OCCULT BLOOD X 1 CARD TO LAB, STOOL: Fecal Occult Bld: POSITIVE

## 2011-08-04 LAB — CLOSTRIDIUM DIFFICILE EIA

## 2012-02-14 ENCOUNTER — Other Ambulatory Visit: Payer: Self-pay | Admitting: Obstetrics and Gynecology

## 2012-02-14 ENCOUNTER — Other Ambulatory Visit (HOSPITAL_COMMUNITY)
Admission: RE | Admit: 2012-02-14 | Discharge: 2012-02-14 | Disposition: A | Discharge status: Home / Self Care | Payer: Self-pay | Source: Ambulatory Visit | Attending: Obstetrics and Gynecology | Admitting: Obstetrics and Gynecology

## 2012-02-14 ENCOUNTER — Other Ambulatory Visit (HOSPITAL_COMMUNITY)
Admission: RE | Admit: 2012-02-14 | Discharge: 2012-02-14 | Disposition: A | Discharge status: Home / Self Care | Payer: BC Managed Care – PPO | Source: Ambulatory Visit | Attending: Obstetrics and Gynecology | Admitting: Obstetrics and Gynecology

## 2012-02-14 DIAGNOSIS — Z113 Encounter for screening for infections with a predominantly sexual mode of transmission: Secondary | ICD-10-CM | POA: Insufficient documentation

## 2012-02-14 DIAGNOSIS — Z01419 Encounter for gynecological examination (general) (routine) without abnormal findings: Secondary | ICD-10-CM | POA: Insufficient documentation

## 2012-02-26 ENCOUNTER — Encounter: Payer: BC Managed Care – PPO | Attending: Internal Medicine | Admitting: *Deleted

## 2012-02-26 DIAGNOSIS — E119 Type 2 diabetes mellitus without complications: Secondary | ICD-10-CM | POA: Insufficient documentation

## 2012-02-26 DIAGNOSIS — Z713 Dietary counseling and surveillance: Secondary | ICD-10-CM | POA: Insufficient documentation

## 2012-02-27 ENCOUNTER — Encounter: Payer: Self-pay | Admitting: *Deleted

## 2012-02-27 NOTE — Progress Notes (Signed)
  Patient was seen on 02/26/12 for the first of a series of three diabetes self-management courses at the [facility].  The following learning objectives were met by the patient during this course:   Defines the role of glucose and insulin  Identifies type of diabetes and pathophysiology  Defines the diagnostic criteria for diabetes and prediabetes  States the risk factors for Type 2 Diabetes  States the symptoms of Type 2 Diabetes  Defines Type 2 Diabetes treatment goals  Defines Type 2 Diabetes treatment options  States the rationale for glucose monitoring  Identifies A1C, glucose targets, and testing times  Identifies proper sharps disposal  Defines the purpose of a diabetes food plan  Identifies carbohydrate food groups  Defines effects of carbohydrate foods on glucose levels  Identifies carbohydrate choices/grams/food labels  States benefits of physical activity and effect on glucose  Review of suggested activity guidelines  Handouts given during class include:  Type 2 Diabetes: Basics Book  My Food Plan Book  Food and Activity Log  Follow-Up Plan: Attend core 2 within 1 month

## 2012-02-27 NOTE — Patient Instructions (Signed)

## 2012-02-29 ENCOUNTER — Ambulatory Visit: Payer: BC Managed Care – PPO

## 2012-03-14 ENCOUNTER — Ambulatory Visit: Payer: BC Managed Care – PPO

## 2012-04-04 ENCOUNTER — Encounter: Payer: BC Managed Care – PPO | Attending: Internal Medicine

## 2012-04-04 DIAGNOSIS — E119 Type 2 diabetes mellitus without complications: Secondary | ICD-10-CM | POA: Insufficient documentation

## 2012-04-04 DIAGNOSIS — Z713 Dietary counseling and surveillance: Secondary | ICD-10-CM | POA: Insufficient documentation

## 2012-04-11 ENCOUNTER — Ambulatory Visit: Payer: BC Managed Care – PPO

## 2012-05-09 ENCOUNTER — Other Ambulatory Visit: Payer: Self-pay | Admitting: Internal Medicine

## 2012-05-09 ENCOUNTER — Ambulatory Visit
Admission: RE | Admit: 2012-05-09 | Discharge: 2012-05-09 | Disposition: A | Discharge status: Home / Self Care | Payer: BC Managed Care – PPO | Source: Ambulatory Visit | Attending: Internal Medicine | Admitting: Internal Medicine

## 2012-05-09 DIAGNOSIS — R51 Headache: Secondary | ICD-10-CM

## 2012-05-09 DIAGNOSIS — R200 Anesthesia of skin: Secondary | ICD-10-CM

## 2012-05-09 DIAGNOSIS — I10 Essential (primary) hypertension: Secondary | ICD-10-CM

## 2012-05-16 ENCOUNTER — Encounter: Payer: BC Managed Care – PPO | Attending: Internal Medicine | Admitting: *Deleted

## 2012-05-16 DIAGNOSIS — E119 Type 2 diabetes mellitus without complications: Secondary | ICD-10-CM | POA: Insufficient documentation

## 2012-05-16 DIAGNOSIS — Z713 Dietary counseling and surveillance: Secondary | ICD-10-CM | POA: Insufficient documentation

## 2012-05-22 ENCOUNTER — Encounter: Payer: Self-pay | Admitting: *Deleted

## 2012-05-22 NOTE — Patient Instructions (Signed)
Goals:  Follow Diabetes Meal Plan as instructed  Eat 3 meals and 2 snacks, every 3-5 hrs  Limit carbohydrate intake to 30-45 grams carbohydrate/meal  Limit carbohydrate intake to 0-15 grams carbohydrate/snack  Add lean protein foods to meals/snacks  Monitor glucose levels as instructed by your doctor  Aim for 15-30 mins of physical activity daily  Bring food record and glucose log to your next nutrition visit   

## 2012-05-22 NOTE — Progress Notes (Signed)
  Patient was seen on 05/16/2012 for the second of a series of three diabetes self-management courses at the [facility]. The following learning objectives were met by the patient during this course:   Explain basic nutrition maintenance and quality assurance  Describe causes, symptoms and treatment of hypoglycemia and hyperglycemia  Explain how to manage diabetes during illness  Describe the importance of good nutrition for health and healthy eating strategies  List strategies to follow meal plan when dining out  Describe the effects of alcohol on glucose and how to use it safely  Describe problem solving skills for day-to-day glucose challenges  Describe strategies to use when treatment plan needs to change  Identify important factors involved in successful weight loss  Describe ways to remain physically active  Describe the impact of regular activity on insulin resistance  Handouts given in class:  Refrigerator magnet for Sick Day Guidelines  [facility] Oral medication/insulin handout  Follow-Up Plan: Patient will attend the final class of the ADA Diabetes Self-Care Education.    

## 2012-05-30 ENCOUNTER — Encounter: Payer: BC Managed Care – PPO | Attending: Internal Medicine | Admitting: *Deleted

## 2012-05-30 ENCOUNTER — Encounter: Payer: Self-pay | Admitting: *Deleted

## 2012-05-30 DIAGNOSIS — E119 Type 2 diabetes mellitus without complications: Secondary | ICD-10-CM

## 2012-05-30 DIAGNOSIS — Z713 Dietary counseling and surveillance: Secondary | ICD-10-CM | POA: Insufficient documentation

## 2012-05-30 NOTE — Patient Instructions (Signed)
Goals:  Follow Diabetes Meal Plan as instructed  Eat 3 meals and 2 snacks, every 3-5 hrs  Limit carbohydrate intake to 30-45 grams carbohydrate/meal  Limit carbohydrate intake to 15 grams carbohydrate/snack  Add lean protein foods to meals/snacks  Monitor glucose levels as instructed by your doctor  Bring food record and glucose log to your next nutrition visit

## 2012-05-30 NOTE — Progress Notes (Signed)
  Patient was seen on 05/30/12 for the third of a series of three diabetes self-management courses at the [facility]. The following learning objectives were met by the patient during this course:    Describe how diabetes changes over time   Identify diabetes complications and ways to prevent them   Describe strategies that can promote heart health including lowering blood pressure and cholesterol   Describe strategies to lower dietary fat and sodium in the diet   Identify physical activities that benefit cardiovascular health   Evaluate success in meeting personal goal   Describe the belief that they can live successfully with diabetes day to day   Establish 2-3 goals that they will plan to diligently work on until they return for the free 1-month follow-up visit  The following handouts were given in class:  3 Month Follow Up Visit handout  Goal setting handout  Class evaluation form  Your patient has established the following 3 month goals for diabetes self-care:  Count carbs at most meal and snacks  Take diabetes meds as scheduled  Follow-Up Plan: Patient will attend a 3 month follow-up visit for diabetes self-management education.

## 2012-08-29 ENCOUNTER — Ambulatory Visit: Payer: BC Managed Care – PPO | Admitting: *Deleted

## 2013-02-18 ENCOUNTER — Other Ambulatory Visit (HOSPITAL_COMMUNITY)
Admission: RE | Admit: 2013-02-18 | Discharge: 2013-02-18 | Disposition: A | Discharge status: Home / Self Care | Payer: Medicare Other | Source: Ambulatory Visit | Attending: Obstetrics and Gynecology | Admitting: Obstetrics and Gynecology

## 2013-02-18 ENCOUNTER — Other Ambulatory Visit: Payer: Self-pay | Admitting: Obstetrics and Gynecology

## 2013-02-18 DIAGNOSIS — Z1151 Encounter for screening for human papillomavirus (HPV): Secondary | ICD-10-CM | POA: Insufficient documentation

## 2013-02-18 DIAGNOSIS — Z01419 Encounter for gynecological examination (general) (routine) without abnormal findings: Secondary | ICD-10-CM | POA: Insufficient documentation

## 2014-03-02 ENCOUNTER — Other Ambulatory Visit: Payer: Self-pay | Admitting: Internal Medicine

## 2014-03-02 ENCOUNTER — Ambulatory Visit
Admission: RE | Admit: 2014-03-02 | Discharge: 2014-03-02 | Disposition: A | Discharge status: Home / Self Care | Payer: Medicare Other | Source: Ambulatory Visit | Attending: Internal Medicine | Admitting: Internal Medicine

## 2014-03-02 DIAGNOSIS — M545 Low back pain, unspecified: Secondary | ICD-10-CM

## 2014-04-12 ENCOUNTER — Emergency Department (HOSPITAL_COMMUNITY)
Admission: EM | Admit: 2014-04-12 | Discharge: 2014-04-12 | Disposition: A | Discharge status: Home / Self Care | Payer: Medicare Other | Attending: Emergency Medicine | Admitting: Emergency Medicine

## 2014-04-12 ENCOUNTER — Encounter (HOSPITAL_COMMUNITY): Payer: Self-pay | Admitting: Emergency Medicine

## 2014-04-12 ENCOUNTER — Emergency Department (HOSPITAL_COMMUNITY): Payer: Medicare Other

## 2014-04-12 DIAGNOSIS — Z9089 Acquired absence of other organs: Secondary | ICD-10-CM | POA: Insufficient documentation

## 2014-04-12 DIAGNOSIS — K5792 Diverticulitis of intestine, part unspecified, without perforation or abscess without bleeding: Secondary | ICD-10-CM

## 2014-04-12 DIAGNOSIS — K219 Gastro-esophageal reflux disease without esophagitis: Secondary | ICD-10-CM | POA: Insufficient documentation

## 2014-04-12 DIAGNOSIS — N289 Disorder of kidney and ureter, unspecified: Secondary | ICD-10-CM | POA: Insufficient documentation

## 2014-04-12 DIAGNOSIS — E119 Type 2 diabetes mellitus without complications: Secondary | ICD-10-CM | POA: Insufficient documentation

## 2014-04-12 DIAGNOSIS — Z79899 Other long term (current) drug therapy: Secondary | ICD-10-CM | POA: Insufficient documentation

## 2014-04-12 DIAGNOSIS — I10 Essential (primary) hypertension: Secondary | ICD-10-CM | POA: Insufficient documentation

## 2014-04-12 DIAGNOSIS — K5732 Diverticulitis of large intestine without perforation or abscess without bleeding: Secondary | ICD-10-CM | POA: Insufficient documentation

## 2014-04-12 LAB — CBC WITH DIFFERENTIAL/PLATELET
BASOS ABS: 0 10*3/uL (ref 0.0–0.1)
BASOS PCT: 0 % (ref 0–1)
EOS PCT: 2 % (ref 0–5)
Eosinophils Absolute: 0.2 10*3/uL (ref 0.0–0.7)
HEMATOCRIT: 36.9 % (ref 36.0–46.0)
Hemoglobin: 12.5 g/dL (ref 12.0–15.0)
Lymphocytes Relative: 19 % (ref 12–46)
Lymphs Abs: 1.9 10*3/uL (ref 0.7–4.0)
MCH: 28.3 pg (ref 26.0–34.0)
MCHC: 33.9 g/dL (ref 30.0–36.0)
MCV: 83.5 fL (ref 78.0–100.0)
MONO ABS: 0.6 10*3/uL (ref 0.1–1.0)
Monocytes Relative: 6 % (ref 3–12)
NEUTROS ABS: 7.3 10*3/uL (ref 1.7–7.7)
Neutrophils Relative %: 73 % (ref 43–77)
Platelets: 167 10*3/uL (ref 150–400)
RBC: 4.42 MIL/uL (ref 3.87–5.11)
RDW: 13.3 % (ref 11.5–15.5)
WBC: 10 10*3/uL (ref 4.0–10.5)

## 2014-04-12 LAB — COMPREHENSIVE METABOLIC PANEL
ALBUMIN: 3.6 g/dL (ref 3.5–5.2)
ALK PHOS: 55 U/L (ref 39–117)
ALT: 17 U/L (ref 0–35)
AST: 16 U/L (ref 0–37)
BUN: 9 mg/dL (ref 6–23)
CALCIUM: 9.2 mg/dL (ref 8.4–10.5)
CO2: 28 mEq/L (ref 19–32)
CREATININE: 0.55 mg/dL (ref 0.50–1.10)
Chloride: 98 mEq/L (ref 96–112)
GFR calc Af Amer: >90 mL/min (ref >90)
GFR calc non Af Amer: >90 mL/min (ref >90)
Glucose, Bld: 194 mg/dL — ABNORMAL HIGH (ref 70–99)
POTASSIUM: 3.6 meq/L — AB (ref 3.7–5.3)
Sodium: 139 mEq/L (ref 137–147)
TOTAL PROTEIN: 7.6 g/dL (ref 6.0–8.3)
Total Bilirubin: 0.4 mg/dL (ref 0.3–1.2)

## 2014-04-12 LAB — URINALYSIS, ROUTINE W REFLEX MICROSCOPIC
BILIRUBIN URINE: NEGATIVE
GLUCOSE, UA: NEGATIVE mg/dL
Hgb urine dipstick: NEGATIVE
KETONES UR: NEGATIVE mg/dL
Leukocytes, UA: NEGATIVE
Nitrite: NEGATIVE
PH: 6.5 (ref 5.0–8.0)
Protein, ur: NEGATIVE mg/dL
Specific Gravity, Urine: 1.013 (ref 1.005–1.030)
Urobilinogen, UA: 0.2 mg/dL (ref 0.0–1.0)

## 2014-04-12 LAB — LIPASE, BLOOD: LIPASE: 22 U/L (ref 11–59)

## 2014-04-12 MED ORDER — SODIUM CHLORIDE 0.9 % IV BOLUS (SEPSIS)
1000.0000 mL | Freq: Once | INTRAVENOUS | Status: AC
  Administered 2014-04-12: 1000 mL via INTRAVENOUS

## 2014-04-12 MED ORDER — METRONIDAZOLE 500 MG PO TABS: 500.0000 mg | ORAL_TABLET | Freq: Two times a day (BID) | ORAL | Status: DC

## 2014-04-12 MED ORDER — MORPHINE SULFATE 4 MG/ML IJ SOLN
4.0000 mg | Freq: Once | INTRAMUSCULAR | Status: AC
  Administered 2014-04-12: 4 mg via INTRAVENOUS
  Filled 2014-04-12: qty 1

## 2014-04-12 MED ORDER — HYDROCODONE-ACETAMINOPHEN 5-325 MG PO TABS: 1.0000 | ORAL_TABLET | ORAL | Status: DC | PRN

## 2014-04-12 MED ORDER — IOHEXOL 300 MG/ML  SOLN
100.0000 mL | Freq: Once | INTRAMUSCULAR | Status: AC | PRN
  Administered 2014-04-12: 100 mL via INTRAVENOUS

## 2014-04-12 MED ORDER — CIPROFLOXACIN HCL 500 MG PO TABS: 500.0000 mg | ORAL_TABLET | Freq: Two times a day (BID) | ORAL | Status: DC

## 2014-04-12 MED ORDER — ONDANSETRON HCL 4 MG/2ML IJ SOLN
4.0000 mg | Freq: Once | INTRAMUSCULAR | Status: AC
  Administered 2014-04-12: 4 mg via INTRAVENOUS
  Filled 2014-04-12: qty 2

## 2014-04-12 MED ORDER — IOHEXOL 300 MG/ML  SOLN
20.0000 mL | Freq: Once | INTRAMUSCULAR | Status: AC | PRN
  Administered 2014-04-12: 20 mL via ORAL

## 2014-04-12 MED ORDER — ONDANSETRON HCL 4 MG PO TABS: 4.0000 mg | ORAL_TABLET | Freq: Four times a day (QID) | ORAL | Status: DC

## 2014-04-12 NOTE — ED Notes (Signed)
The pt has had lower abd pain since Friday no nv or diarrhea  Hx divertulitis

## 2014-04-12 NOTE — ED Notes (Signed)
Pt to CT

## 2014-04-12 NOTE — ED Provider Notes (Signed)
Medical screening examination/treatment/procedure(s) were conducted as a shared visit with non-physician practitioner(s) and myself.  I personally evaluated the patient during the encounter.   EKG Interpretation None     Moderate left lower quadrant tenderness with referred pain to the left lower quadrant when the rest of the abdomen is palpated but no rebound.  Hurman HornJohn M Bednar, MD 04/13/14 [phone]

## 2014-04-12 NOTE — Discharge Instructions (Signed)

## 2014-04-12 NOTE — ED Notes (Signed)
Pt finished drinking oral CT contrast.  

## 2014-04-12 NOTE — ED Provider Notes (Signed)
CSN: [id number]     Arrival date & time 04/12/14  0518 History   First MD Initiated Contact with Patient 04/12/14 [phone]     Chief Complaint  Patient presents with  . Abdominal Pain     (Consider location/radiation/quality/duration/timing/severity/associated sxs/prior Treatment) HPI Comments: Patient is a 68 year old female with history of diabetes, GERD, hypertension, and diverticulitis who presents to the emergency department for evaluation of abdominal pain. She reports the pain has been intermittent over the past two weeks, but on Friday worsened and became constant. She describes the pain as a pressure and "like gas bubbles". She has taken tylenol without relief. She has associated decreased appetite. She denies associated nausea, vomiting, diarrhea. This feels like the diverticulitis she has had in the past. Prior abdominal surgeries include cholecystectomy.   Patient is a 68 y.o. female presenting with abdominal pain. The history is provided by the patient. No language interpreter was used.  Abdominal Pain Associated symptoms: no chest pain, no chills, no diarrhea, no fever, no nausea, no shortness of breath and no vomiting     Past Medical History  Diagnosis Date  . Diabetes mellitus   . GERD (gastroesophageal reflux disease)   . Hypertension    Past Surgical History  Procedure Laterality Date  . Cholecystectomy     No family history on file. History  Substance Use Topics  . Smoking status: Never Smoker   . Smokeless tobacco: Not on file  . Alcohol Use: No   OB History   Grav Para Term Preterm Abortions TAB SAB Ect Mult Living                 Review of Systems  Constitutional: Positive for appetite change. Negative for fever and chills.  Respiratory: Negative for shortness of breath.   Cardiovascular: Negative for chest pain.  Gastrointestinal: Positive for abdominal pain. Negative for nausea, vomiting, diarrhea and blood in stool.  All other systems reviewed and are  negative.     Allergies  Review of patient's allergies indicates no known allergies.  Home Medications   Prior to Admission medications   Medication Sig Start Date End Date Taking? Authorizing Provider  FLUOXETINE HCL PO Take 1 capsule by mouth daily.   Yes Historical Provider, MD  omeprazole (PRILOSEC) 10 MG capsule Take 10 mg by mouth daily.   Yes Historical Provider, MD  sitaGLIPtin (JANUVIA) 25 MG tablet Take 25 mg by mouth daily.   Yes Historical Provider, MD   BP 151/80  Pulse 79  Temp(Src) 98.9 F (37.2 C) (Oral)  Resp 15  SpO2 94% Physical Exam  Nursing note and vitals reviewed. Constitutional: She is oriented to person, place, and time. She appears well-developed and well-nourished. No distress.  HENT:  Head: Normocephalic and atraumatic.  Right Ear: External ear normal.  Left Ear: External ear normal.  Nose: Nose normal.  Mouth/Throat: Oropharynx is clear and moist.  Eyes: Conjunctivae are normal.  Neck: Normal range of motion.  Cardiovascular: Normal rate, regular rhythm and normal heart sounds.   Pulmonary/Chest: Effort normal and breath sounds normal. No stridor. No respiratory distress. She has no wheezes. She has no rales.  Abdominal: Soft. Bowel sounds are normal. She exhibits no distension. There is tenderness in the left lower quadrant. There is no rigidity and no guarding.  Musculoskeletal: Normal range of motion.  Neurological: She is alert and oriented to person, place, and time. She has normal strength.  Skin: Skin is warm and dry. She is not  diaphoretic. No erythema.  Psychiatric: She has a normal mood and affect. Her behavior is normal.    ED Course  Procedures (including critical care time) Labs Review Labs Reviewed  COMPREHENSIVE METABOLIC PANEL - Abnormal; Notable for the following:    Potassium 3.6 (*)    Glucose, Bld 194 (*)    All other components within normal limits  URINE CULTURE  CBC WITH DIFFERENTIAL  LIPASE, BLOOD  URINALYSIS,  ROUTINE W REFLEX MICROSCOPIC    Imaging Review Ct Abdomen Pelvis W Contrast  04/12/2014   CLINICAL DATA:  Lower abdominal pain for the past 2 days. History of diverticulitis.  EXAM: CT ABDOMEN AND PELVIS WITH CONTRAST  TECHNIQUE: Multidetector CT imaging of the abdomen and pelvis was performed using the standard protocol following bolus administration of intravenous contrast.  CONTRAST:  100mL OMNIPAQUE IOHEXOL 300 MG/ML SOLN, 20mL OMNIPAQUE IOHEXOL 300 MG/ML SOLN  COMPARISON:  No priors.  FINDINGS: Lung Bases: Small hiatal hernia. Scattered areas of mild scarring throughout the lung bases bilaterally.  Abdomen/Pelvis: Status post cholecystectomy. The appearance of the liver, pancreas, spleen and bilateral adrenal glands is unremarkable. Multiple sub cm low-attenuation lesions in the kidneys bilaterally are too small to characterize, but are likely to represent small cysts. There is also a 3.1 cm simple cyst in the anterior aspect of the lower pole the right kidney. In the medial aspect of the lower pole of the left kidney there is also a 1.7 cm intermediate to high attenuation (51 HU aunt portal venous phase imaging, and 55 HU on delayed imaging) lesion which is indeterminate.  Numerous colonic diverticulae are noted, most pronounced in the descending and sigmoid colon. In the proximal sigmoid colon there is haziness in the adjacent mesocolic fat, where there is also some soft tissue stranding, concerning for early acute diverticulitis. The wall of the colon is markedly thickened in this region, best appreciated on image 61 of series 2. No definite diverticular abscess or signs to suggest frank perforation are noted at this time.  No significant volume of ascites. No pneumoperitoneum. No pathologic distention of small bowel. Normal appendix. No lymphadenopathy identified in the abdomen or pelvis. Uterus and ovaries are unremarkable in appearance. Urinary bladder is normal in appearance.  Musculoskeletal: There  are no aggressive appearing lytic or blastic lesions noted in the visualized portions of the skeleton.  IMPRESSION: 1. Findings are compatible with early acute diverticulitis involving the proximal sigmoid colon. No diverticular abscess or signs of frank perforation are noted at this time. The colonic wall is markedly thickened in the area of inflammation. This favored to be related to be acute infectious/ inflammatory process, however, followup evaluation with nonemergent colonoscopy after the patient's acute illness has resolved is recommended to exclude the possibility of an underlying colonic wall neoplasm in the proximal sigmoid colon. 2. 1.7 cm indeterminate lesion in the lower pole of the left kidney. While this may simply represent a proteinaceous or hemorrhagic cyst, further evaluation with nonemergent MRI with and without IV gadolinium in the near future it is recommended to better characterize this lesion, as an enhancing renal neoplasm is not excluded. 3. Status post cholecystectomy. 4. Normal appendix. 5. Small hiatal hernia. 6. Additional incidental findings, as above.   Electronically Signed   By: Trudie Reedaniel  Entrikin M.D.   On: 04/12/2014 12:23     EKG Interpretation None      MDM   Final diagnoses:  Diverticulitis of intestine without perforation or abscess without bleeding  Lesion of left  native kidney    Patient presents to the emergency department with left lower quadrant abdominal pain. Her abdomen is tender in the left lower quadrant without rebound or guarding. A CT scan was done as she does not have record of a scan since 2003. This was consistent with early diverticulitis. I discussed with patient the need for a colonoscopy after this acute illness is resolved to rule out colon cancer. I also discussed with patient the need for a nonemergent MRI to evaluate a lesion on her left kidney. The patient expressed understanding and a printout was given to the patient. The patient can  tolerate by mouth. She was given prescription for Cipro and Flagyl. Additionally she was given Zofran and Norco for nausea and pain. Discussed reasons to return to the emergency department immediately. Vital signs stable for discharge. Discussed case with Dr. Lestine Box who agrees with plan. Patient / Family / Caregiver informed of clinical course, understand medical decision-making process, and agree with plan.     Mora Bellman, PA-C 04/12/14 [phone]

## 2014-04-13 LAB — URINE CULTURE: Colony Count: 75000

## 2014-04-13 NOTE — ED Provider Notes (Signed)
Medical screening examination/treatment/procedure(s) were performed by non-physician practitioner and as supervising physician I was immediately available for consultation/collaboration.   EKG Interpretation None        Joshua M Zavitz, MD 04/13/14 0756 

## 2014-07-12 ENCOUNTER — Emergency Department (HOSPITAL_COMMUNITY): Payer: Medicare Other

## 2014-07-12 ENCOUNTER — Emergency Department (HOSPITAL_COMMUNITY)
Admission: EM | Admit: 2014-07-12 | Discharge: 2014-07-12 | Disposition: A | Discharge status: Home / Self Care | Payer: Medicare Other | Attending: Emergency Medicine | Admitting: Emergency Medicine

## 2014-07-12 ENCOUNTER — Encounter (HOSPITAL_COMMUNITY): Payer: Self-pay | Admitting: Emergency Medicine

## 2014-07-12 DIAGNOSIS — Z792 Long term (current) use of antibiotics: Secondary | ICD-10-CM | POA: Insufficient documentation

## 2014-07-12 DIAGNOSIS — I10 Essential (primary) hypertension: Secondary | ICD-10-CM | POA: Diagnosis not present

## 2014-07-12 DIAGNOSIS — W1809XA Striking against other object with subsequent fall, initial encounter: Secondary | ICD-10-CM | POA: Insufficient documentation

## 2014-07-12 DIAGNOSIS — Y9389 Activity, other specified: Secondary | ICD-10-CM | POA: Diagnosis not present

## 2014-07-12 DIAGNOSIS — S0083XA Contusion of other part of head, initial encounter: Secondary | ICD-10-CM | POA: Insufficient documentation

## 2014-07-12 DIAGNOSIS — E119 Type 2 diabetes mellitus without complications: Secondary | ICD-10-CM | POA: Insufficient documentation

## 2014-07-12 DIAGNOSIS — S1093XA Contusion of unspecified part of neck, initial encounter: Secondary | ICD-10-CM | POA: Diagnosis not present

## 2014-07-12 DIAGNOSIS — S62639A Displaced fracture of distal phalanx of unspecified finger, initial encounter for closed fracture: Secondary | ICD-10-CM | POA: Insufficient documentation

## 2014-07-12 DIAGNOSIS — S62609A Fracture of unspecified phalanx of unspecified finger, initial encounter for closed fracture: Secondary | ICD-10-CM

## 2014-07-12 DIAGNOSIS — Z79899 Other long term (current) drug therapy: Secondary | ICD-10-CM | POA: Diagnosis not present

## 2014-07-12 DIAGNOSIS — S0990XA Unspecified injury of head, initial encounter: Secondary | ICD-10-CM | POA: Diagnosis present

## 2014-07-12 DIAGNOSIS — Y9289 Other specified places as the place of occurrence of the external cause: Secondary | ICD-10-CM | POA: Diagnosis not present

## 2014-07-12 DIAGNOSIS — S0003XA Contusion of scalp, initial encounter: Secondary | ICD-10-CM | POA: Insufficient documentation

## 2014-07-12 DIAGNOSIS — IMO0002 Reserved for concepts with insufficient information to code with codable children: Secondary | ICD-10-CM | POA: Diagnosis not present

## 2014-07-12 DIAGNOSIS — Z23 Encounter for immunization: Secondary | ICD-10-CM | POA: Insufficient documentation

## 2014-07-12 DIAGNOSIS — K219 Gastro-esophageal reflux disease without esophagitis: Secondary | ICD-10-CM | POA: Insufficient documentation

## 2014-07-12 MED ORDER — TETANUS-DIPHTH-ACELL PERTUSSIS 5-2.5-18.5 LF-MCG/0.5 IM SUSP: 0.5000 mL | Freq: Once | INTRAMUSCULAR | Status: DC

## 2014-07-12 NOTE — ED Provider Notes (Signed)
CSN: [id number]     Arrival date & time 07/12/14  1621 History   None    This chart was scribed for non-physician practitioner,Hope Damian Leavell NP working with Layla Maw Ward, DO by Arlan Organ, ED Scribe. This patient was seen in room TR08C/TR08C and the patient's care was started at 6:36 PM.   Chief Complaint  Patient presents with  . Fall   Patient is a 68 y.o. female presenting with fall. The history is provided by the patient. No language interpreter was used.  Fall This is a new problem. The current episode started yesterday. The problem has been gradually improving. Associated symptoms include arthralgias. Pertinent negatives include no chills, fever, nausea or visual change.    HPI Comments: [name] is a 68 y.o. female with a PMHx of DM, GERD, and HTN who presents to the Emergency Department complaining of a fall sustained 1 day ago. Pt states she stumbled and fell face forward onto concrete yesterday. She denies any LOC. She now c/o bruising, swelling, and pain to the L side of her face. She denies any blurred vision or double vision. She denies any bleedings from ears or nose. Pt applied OTC Neosporin to area last night. She also complain of pain and abrasion to the left hand. No known allergies to medications. She has no other pertinent past medical history. No other concerns this visit.    Past Medical History  Diagnosis Date  . Diabetes mellitus   . GERD (gastroesophageal reflux disease)   . Hypertension    Past Surgical History  Procedure Laterality Date  . Cholecystectomy     No family history on file. History  Substance Use Topics  . Smoking status: Never Smoker   . Smokeless tobacco: Not on file  . Alcohol Use: No   OB History   Grav Para Term Preterm Abortions TAB SAB Ect Mult Living                 Review of Systems  Constitutional: Negative for fever and chills.  Eyes: Negative for visual disturbance.  Gastrointestinal: Negative for nausea.   Musculoskeletal: Positive for arthralgias.       Left hand pain  Skin: Positive for wound.  All other systems reviewed and are negative.   Allergies  Review of patient's allergies indicates no known allergies.  Home Medications   Prior to Admission medications   Medication Sig Start Date End Date Taking? Authorizing Provider  ciprofloxacin (CIPRO) 500 MG tablet Take 1 tablet (500 mg total) by mouth every 12 (twelve) hours. 04/12/14   Mora Bellman, PA-C  FLUoxetine (PROZAC) 40 MG capsule Take 40 mg by mouth daily.    Historical Provider, MD  HYDROcodone-acetaminophen (NORCO/VICODIN) 5-325 MG per tablet Take 1-2 tablets by mouth every 4 (four) hours as needed for moderate pain or severe pain. 04/12/14   Mora Bellman, PA-C  metoprolol (LOPRESSOR) 50 MG tablet Take 50 mg by mouth daily.    Historical Provider, MD  metroNIDAZOLE (FLAGYL) 500 MG tablet Take 1 tablet (500 mg total) by mouth 2 (two) times daily. 04/12/14   Mora Bellman, PA-C  omeprazole (PRILOSEC) 10 MG capsule Take 10 mg by mouth daily.    Historical Provider, MD  ondansetron (ZOFRAN) 4 MG tablet Take 1 tablet (4 mg total) by mouth every 6 (six) hours. 04/12/14   Mora Bellman, PA-C  ramipril (ALTACE) 10 MG capsule Take 10 mg by mouth daily.    Historical Provider, MD  sitaGLIPtin (JANUVIA) 25 MG tablet Take 25 mg by mouth daily.    Historical Provider, MD   Triage Vitals: BP 134/77  Pulse 82  Temp(Src) 99.1 F (37.3 C) (Oral)  Resp 18  Ht $RemoveBef 9 m)  Wt 235 lb (106.595 kg)  BMI 47.44 kg/m2  SpO2 95%   Physical Exam  Nursing note and vitals reviewed. Constitutional: She is oriented to person, place, and time. She appears well-developed and well-nourished. No distress.  HENT:  Head: Normocephalic.    Right Ear: Tympanic membrane normal. No hemotympanum.  Left Ear: Tympanic membrane normal. No hemotympanum.  Nose: No epistaxis.  Mouth/Throat: Uvula is midline, oropharynx is clear and moist and  mucous membranes are normal.  Superficial laceration that's beginning to scab over just below L eye laterally Hematoma noted under L eye Tenderness to palpation right under L eye  Eyes: EOM are normal. Pupils are equal, round, and reactive to light.   No entrapment   Neck: Normal range of motion.  Cardiovascular: Normal rate, regular rhythm and normal heart sounds.   Pulmonary/Chest: Effort normal and breath sounds normal.  Abdominal: She exhibits no distension.  Musculoskeletal: Normal range of motion.       Left hand: She exhibits tenderness, bony tenderness and swelling. She exhibits normal range of motion, normal capillary refill and no deformity. Lacerations: abrasion of the little finger. Normal sensation noted. Normal strength noted.  Neurological: She is alert and oriented to person, place, and time.  Skin:  ecchymosis  Psychiatric: She has a normal mood and affect. Her behavior is normal. Judgment and thought content normal.    ED Course  Procedures (including critical care time)  DIAGNOSTIC STUDIES: Oxygen Saturation is 95% on RA, adequate by my interpretation.    COORDINATION OF CARE: 6:36 PM- Will order DG hand complete L and CT orbits without CM. Discussed treatment plan with pt at bedside and pt agreed to plan.     8:41 PM- Dr. Baxter Hire Ward in to examine pt prior to discharge.  Labs Review Labs Reviewed - No data to display  Imaging Review Dg Hand Complete Left  07/12/2014   CLINICAL DATA:  Fall sustained 1 day ago.  Left hand pain.  EXAM: LEFT HAND - COMPLETE 3+ VIEW  COMPARISON:  None.  FINDINGS: Nondisplaced, nonangulated and non comminuted fracture at the base of the distal phalanx of the fourth finger.  No other fractures.  Joints are normally aligned. There are mild osteoarthritic changes involving several of the interphalangeal joints most evident at the IP joint of the thumb.  Soft tissues are unremarkable.  IMPRESSION: Nondisplaced fracture at the base of the  distal phalanx of the left fourth finger. No other fractures. No dislocation.   Electronically Signed   By: Amie Portland M.D.   On: 07/12/2014 20:08   Ct Orbitss W/o Cm  07/12/2014   CLINICAL DATA:  Larey Seat last night, left orbit hematoma  EXAM: CT ORBITS WITHOUT CONTRAST  TECHNIQUE: Multidetector CT imaging of the orbits was performed following the standard protocol without intravenous contrast.  COMPARISON:  05/09/2012  FINDINGS: No air-fluid levels in the paranasal sinuses. No evidence of nasal bone fracture. Both orbits appear intact. No evidence of soft tissue orbital abnormality.  IMPRESSION: Negative   Electronically Signed   By: Esperanza Heir M.D.   On: 07/12/2014 19:22     MDM  68 y.o. female with left hand pain and left orbit pain s/p fall on concrete yesterday. Splint applied to  left hand to treat fracture of the left 4th finger. Ice, elevation and pain management. Ice to the left side of face. She will follow up with her PCP or return here as needed. I have reviewed this patient's vital signs, nurses notes, appropriate labs and imaging.  I have discussed findings and plan of care with the patient and she voices understanding and agrees with plan.    I personally performed the services described in this documentation, which was scribed in my presence. The recorded information has been reviewed and is accurate.    [place], [place] 07/13/14 [phone]

## 2014-07-12 NOTE — Progress Notes (Signed)
Orthopedic Tech Progress Note Patient Details:  [name] 04/05/1946 [id number] Applied static finger splint to Lt. 4th finger.  Movement and sensation intact before and after splinting.  Capillary refill less than 2 seconds before and after splinting. Ortho Devices Type of Ortho Device: Finger splint Ortho Device/Splint Location: LUE 4th finger Ortho Device/Splint Interventions: Application   Lesle Chris 07/12/2014, 10:23 PM

## 2014-07-12 NOTE — ED Notes (Signed)
The pt stumbled and fell on the concrete yesterday.  She fell face forward and noiw she has bruises swelling and pain to the lt side of her face.  No loc.  She has soreness all over.

## 2014-07-13 NOTE — ED Provider Notes (Signed)
Medical screening examination/treatment/procedure(s) were conducted as a shared visit with non-physician practitioner(s) and myself.  I personally evaluated the patient during the encounter.   EKG Interpretation None      Pt is a 68 y.o. F with mechanical fall yesterday.  Periorbital swelling and ecchymosis to left eye and left 4th finger pain.  No LOC.  No anticoagulants.  Neuro intact.  Ct orbit negative.  Left 4th finger distal fracture, will splint.  Layla Maw Ward, DO 07/13/14 1430

## 2014-08-14 ENCOUNTER — Other Ambulatory Visit: Payer: Self-pay | Admitting: Internal Medicine

## 2014-08-14 DIAGNOSIS — R1030 Lower abdominal pain, unspecified: Secondary | ICD-10-CM

## 2014-08-14 DIAGNOSIS — N281 Cyst of kidney, acquired: Secondary | ICD-10-CM

## 2014-08-21 ENCOUNTER — Other Ambulatory Visit: Payer: BC Managed Care – PPO

## 2014-08-21 ENCOUNTER — Ambulatory Visit
Admission: RE | Admit: 2014-08-21 | Discharge: 2014-08-21 | Disposition: A | Discharge status: Home / Self Care | Payer: Medicare Other | Source: Ambulatory Visit | Attending: Internal Medicine | Admitting: Internal Medicine

## 2014-08-21 DIAGNOSIS — N281 Cyst of kidney, acquired: Secondary | ICD-10-CM

## 2014-08-21 DIAGNOSIS — R1030 Lower abdominal pain, unspecified: Secondary | ICD-10-CM

## 2014-08-21 MED ORDER — GADOBENATE DIMEGLUMINE 529 MG/ML IV SOLN
20.0000 mL | Freq: Once | INTRAVENOUS | Status: AC | PRN
  Administered 2014-08-21: 20 mL via INTRAVENOUS

## 2014-09-09 ENCOUNTER — Other Ambulatory Visit: Payer: Self-pay | Admitting: Gastroenterology

## 2014-09-10 ENCOUNTER — Encounter (HOSPITAL_COMMUNITY): Payer: Self-pay | Admitting: *Deleted

## 2014-09-14 ENCOUNTER — Ambulatory Visit (HOSPITAL_COMMUNITY): Payer: Medicare Other | Admitting: Anesthesiology

## 2014-09-14 ENCOUNTER — Ambulatory Visit (HOSPITAL_COMMUNITY)
Admission: RE | Admit: 2014-09-14 | Discharge: 2014-09-14 | Disposition: A | Discharge status: Home / Self Care | Payer: Medicare Other | Source: Ambulatory Visit | Attending: Gastroenterology | Admitting: Gastroenterology

## 2014-09-14 ENCOUNTER — Encounter (HOSPITAL_COMMUNITY)
Admission: RE | Disposition: A | Discharge status: Home / Self Care | Payer: Self-pay | Source: Ambulatory Visit | Attending: Gastroenterology

## 2014-09-14 ENCOUNTER — Encounter (HOSPITAL_COMMUNITY): Payer: Self-pay | Admitting: *Deleted

## 2014-09-14 DIAGNOSIS — K5730 Diverticulosis of large intestine without perforation or abscess without bleeding: Secondary | ICD-10-CM | POA: Diagnosis not present

## 2014-09-14 DIAGNOSIS — G4733 Obstructive sleep apnea (adult) (pediatric): Secondary | ICD-10-CM | POA: Diagnosis not present

## 2014-09-14 DIAGNOSIS — K5289 Other specified noninfective gastroenteritis and colitis: Secondary | ICD-10-CM | POA: Insufficient documentation

## 2014-09-14 DIAGNOSIS — E669 Obesity, unspecified: Secondary | ICD-10-CM | POA: Insufficient documentation

## 2014-09-14 DIAGNOSIS — Z1211 Encounter for screening for malignant neoplasm of colon: Secondary | ICD-10-CM | POA: Diagnosis not present

## 2014-09-14 DIAGNOSIS — E119 Type 2 diabetes mellitus without complications: Secondary | ICD-10-CM | POA: Diagnosis not present

## 2014-09-14 DIAGNOSIS — I10 Essential (primary) hypertension: Secondary | ICD-10-CM | POA: Insufficient documentation

## 2014-09-14 DIAGNOSIS — Z800 Family history of malignant neoplasm of digestive organs: Secondary | ICD-10-CM | POA: Insufficient documentation

## 2014-09-14 DIAGNOSIS — F329 Major depressive disorder, single episode, unspecified: Secondary | ICD-10-CM | POA: Diagnosis not present

## 2014-09-14 DIAGNOSIS — M8580 Other specified disorders of bone density and structure, unspecified site: Secondary | ICD-10-CM | POA: Insufficient documentation

## 2014-09-14 HISTORY — DX: Unspecified osteoarthritis, unspecified site: M19.90

## 2014-09-14 HISTORY — DX: Sleep apnea, unspecified: G47.30

## 2014-09-14 HISTORY — DX: Palpitations: R00.2

## 2014-09-14 HISTORY — DX: Anxiety disorder, unspecified: F41.9

## 2014-09-14 HISTORY — DX: Adverse effect of unspecified anesthetic, initial encounter: T41.45XA

## 2014-09-14 HISTORY — PX: COLONOSCOPY WITH PROPOFOL: SHX5780

## 2014-09-14 HISTORY — DX: Other complications of anesthesia, initial encounter: T88.59XA

## 2014-09-14 LAB — GLUCOSE, CAPILLARY: GLUCOSE-CAPILLARY: 161 mg/dL — AB (ref 70–99)

## 2014-09-14 SURGERY — COLONOSCOPY WITH PROPOFOL
Anesthesia: Monitor Anesthesia Care

## 2014-09-14 MED ORDER — LACTATED RINGERS IV SOLN
INTRAVENOUS | Status: DC
  Administered 2014-09-14: 1000 mL via INTRAVENOUS

## 2014-09-14 MED ORDER — PROPOFOL INFUSION 10 MG/ML OPTIME: INTRAVENOUS | Status: DC | PRN

## 2014-09-14 MED ORDER — PROPOFOL 10 MG/ML IV BOLUS
INTRAVENOUS | Status: AC
  Filled 2014-09-14: qty 20

## 2014-09-14 MED ORDER — PROPOFOL 10 MG/ML IV BOLUS
INTRAVENOUS | Status: DC | PRN
  Administered 2014-09-14: 250 mg via INTRAVENOUS

## 2014-09-14 MED ORDER — SODIUM CHLORIDE 0.9 % IV SOLN: INTRAVENOUS | Status: DC

## 2014-09-14 SURGICAL SUPPLY — 21 items

## 2014-09-14 NOTE — Anesthesia Postprocedure Evaluation (Signed)
  Anesthesia Post-op Note  Patient: [name]  Procedure(s) Performed: Procedure(s) (LRB): COLONOSCOPY WITH PROPOFOL (N/A)  Patient Location: PACU  Anesthesia Type: MAC  Level of Consciousness: awake and alert   Airway and Oxygen Therapy: Patient Spontanous Breathing  Post-op Pain: mild  Post-op Assessment: Post-op Vital signs reviewed, Patient's Cardiovascular Status Stable, Respiratory Function Stable, Patent Airway and No signs of Nausea or vomiting  Last Vitals:  Filed Vitals:   09/14/14 1250  BP: 164/104  Pulse: 51  Temp:   Resp: 12    Post-op Vital Signs: stable   Complications: No apparent anesthesia complications

## 2014-09-14 NOTE — Op Note (Signed)
Problem: Abdominal pain. Acute sigmoid colon diverticulitis treated on 07/10/2014  Endoscopist: Danise EdgeMartin Johnson  Premedication: Propofol administered by anesthesia  Procedure: Diagnostic colonoscopy with random colon biopsies to rule out microscopic colitis The patient was placed in the left lateral decubitus position. Anal inspection and digital rectal exam were normal. The Pentax pediatric colonoscope was introduced into the rectum and advanced to the cecum. A normal-appearing appendiceal orifice was identified. A normal-appearing ileocecal valve was identified. Colonic preparation for the exam today was good. Withdrawal time was 10 minutes  Rectum. Normal. Retroflexed view of the distal rectum normal  Sigmoid colon and descending colon. Left colonic diverticulosis  Splenic flexure. Normal  Transverse colon. Normal  Hepatic flexure. Normal  Ascending colon. Normal  Cecum and ileocecal valve. Normal  Random colon biopsies. Biopsies taken from the right colon and left colon to look for microscopic colitis  Assessment: Normal screening colonoscopy. Left colonic diverticulosis. Random colon biopsies to rule out microscopic colitis pending.  Recommendation: Schedule repeat screening colonoscopy in 5 years

## 2014-09-14 NOTE — Anesthesia Preprocedure Evaluation (Signed)
Anesthesia Evaluation  Patient identified by MRN, date of birth, ID band Patient awake    Reviewed: Allergy & Precautions, H&P , NPO status , Patient's Chart, lab work & pertinent test results  Airway Mallampati: III  TM Distance: <3 FB Neck ROM: Full    Dental no notable dental hx.    Pulmonary sleep apnea ,  breath sounds clear to auscultation  Pulmonary exam normal       Cardiovascular hypertension, Pt. on medications and Pt. on home beta blockers Rhythm:Regular Rate:Normal     Neuro/Psych negative neurological ROS  negative psych ROS   GI/Hepatic negative GI ROS, Neg liver ROS,   Endo/Other  diabetes, Insulin DependentMorbid obesity  Renal/GU negative Renal ROS  negative genitourinary   Musculoskeletal negative musculoskeletal ROS (+)   Abdominal   Peds negative pediatric ROS (+)  Hematology negative hematology ROS (+)   Anesthesia Other Findings   Reproductive/Obstetrics negative OB ROS                             Anesthesia Physical Anesthesia Plan  ASA: III  Anesthesia Plan: MAC   Post-op Pain Management:    Induction: Intravenous  Airway Management Planned: Nasal Cannula  Additional Equipment:   Intra-op Plan:   Post-operative Plan:   Informed Consent: I have reviewed the patients History and Physical, chart, labs and discussed the procedure including the risks, benefits and alternatives for the proposed anesthesia with the patient or authorized representative who has indicated his/her understanding and acceptance.   Dental advisory given  Plan Discussed with: CRNA and Surgeon  Anesthesia Plan Comments:         Anesthesia Quick Evaluation

## 2014-09-14 NOTE — H&P (Signed)
  Problem: Abdominal pain. Diverticulitis treated with ciprofloxacin and metronidazole on 07/10/2014. 08/21/2014 MRI of the abdomen showed hepatic steatosis. 04/12/2014 CT scan of the abdomen and pelvis showed acute sigmoid diverticulitis. Normal colonoscopy performed on 06/15/2011. Normal nuclear medicine gastric emptying study performed on 10/04/2010. Normal esophagogastroduodenoscopy performed on 09/22/2010. Normal esophagogastroduodenoscopy performed on 06/13/2007. Normal esophagogastroduodenoscopy performed on 04/15/2003. Normal colonoscopy performed on 03/04/2003.  History: The patient is a 68 year old female born Mar 13, 1946. Her father was diagnosed with colon cancer. In September 2015, the patient developed left lower quadrant abdominal pain and was prescribed a course of ciprofloxacin plus metronidazole. She has completed her course of antibiotic therapy. In October 2015, MRI of the abdomen showed hepatic steatosis.  Since June 2015, the patient has had recurring bouts of crampy lower abdominal pain associated with the passage of frequent small-volume soft bowel movements unassociated with gastrointestinal bleeding. Her attacks may occur at night and awaken her from a sound sleep.  The patient is scheduled to undergo diagnostic colonoscopy with performance on random colon biopsies to look for microscopic colitis  Past medical history: Type 2 diabetes mellitus. Hypertension. Obstructive sleep apnea syndrome. Depression. Osteopenia. Cholecystectomy. Cesarean section. Leg fracture surgery.  Family history: Father diagnosed with colon cancer. Mother diagnosed with cervical cancer and ovarian cancer.  Medication allergies: None  Exam: The patient is alert and lying comfortably on the endoscopy stretcher. Abdomen is soft and nontender to palpation. Lungs are clear to auscultation. Cardiac exam reveals a regular rhythm.  Plan: Proceed with diagnostic colonoscopy and screen for microscopic colitis

## 2014-09-14 NOTE — Discharge Instructions (Signed)

## 2014-09-15 ENCOUNTER — Encounter (HOSPITAL_COMMUNITY): Payer: Self-pay | Admitting: Gastroenterology

## 2015-05-13 ENCOUNTER — Encounter (HOSPITAL_COMMUNITY): Payer: Self-pay

## 2015-05-13 ENCOUNTER — Emergency Department (HOSPITAL_COMMUNITY): Payer: Medicare Other

## 2015-05-13 ENCOUNTER — Emergency Department (HOSPITAL_COMMUNITY)
Admission: EM | Admit: 2015-05-13 | Discharge: 2015-05-14 | Disposition: A | Discharge status: Home / Self Care | Payer: Medicare Other | Attending: Emergency Medicine | Admitting: Emergency Medicine

## 2015-05-13 DIAGNOSIS — F419 Anxiety disorder, unspecified: Secondary | ICD-10-CM | POA: Diagnosis not present

## 2015-05-13 DIAGNOSIS — Z8781 Personal history of (healed) traumatic fracture: Secondary | ICD-10-CM | POA: Insufficient documentation

## 2015-05-13 DIAGNOSIS — M1990 Unspecified osteoarthritis, unspecified site: Secondary | ICD-10-CM | POA: Diagnosis not present

## 2015-05-13 DIAGNOSIS — E119 Type 2 diabetes mellitus without complications: Secondary | ICD-10-CM | POA: Insufficient documentation

## 2015-05-13 DIAGNOSIS — Z794 Long term (current) use of insulin: Secondary | ICD-10-CM | POA: Insufficient documentation

## 2015-05-13 DIAGNOSIS — Z79899 Other long term (current) drug therapy: Secondary | ICD-10-CM | POA: Diagnosis not present

## 2015-05-13 DIAGNOSIS — M25562 Pain in left knee: Secondary | ICD-10-CM | POA: Insufficient documentation

## 2015-05-13 DIAGNOSIS — K219 Gastro-esophageal reflux disease without esophagitis: Secondary | ICD-10-CM | POA: Insufficient documentation

## 2015-05-13 DIAGNOSIS — I10 Essential (primary) hypertension: Secondary | ICD-10-CM | POA: Diagnosis not present

## 2015-05-13 MED ORDER — ACETAMINOPHEN-CODEINE #3 300-30 MG PO TABS: 1.0000 | ORAL_TABLET | Freq: Four times a day (QID) | ORAL | Status: DC | PRN

## 2015-05-13 MED ORDER — DICLOFENAC SODIUM 1 % TD GEL: 4.0000 g | Freq: Four times a day (QID) | TRANSDERMAL | Status: DC

## 2015-05-13 MED ORDER — TRAMADOL HCL 50 MG PO TABS
50.0000 mg | ORAL_TABLET | Freq: Once | ORAL | Status: AC
  Administered 2015-05-14: 50 mg via ORAL
  Filled 2015-05-13: qty 1

## 2015-05-13 NOTE — ED Provider Notes (Signed)
CSN: [id number]     Arrival date & time 05/13/15  2140 History  This chart was scribed for non-physician practitioner, Antony Madura, PA-C, working with Tilden Fossa, MD, by Roxy Cedar ED Scribe. This patient was seen in room WTR7/WTR7 and the patient's care was started at 10:57 PM    Chief Complaint  Patient presents with  . Knee Pain   Patient is a 69 y.o. female presenting with knee pain. The history is provided by the patient. No language interpreter was used.  Knee Pain  HPI Comments: [name] is a 69 y.o. female with a PMHx of type II diabetes, GERD, hypertension, arthritis, who presents to the Emergency Department complaining of moderate, intermittent stabbing left knee pain that worsened 3 days ago. She states that the pain has worsened today and radiates to her upper and lower left leg. She has been taking tylenol extra strength with no relief. She reports associated difficulty to ambulate. Pt was seen by orthopedist with [facility] about 11 years ago when she had right knee pain. She denies associated fall or trauma. Pt denies recent or new increase in swelling or redness. She denies hx of kidney problems.   Past Medical History  Diagnosis Date  . Diabetes mellitus   . GERD (gastroesophageal reflux disease)   . Hypertension   . Heart palpitations     heart palpitaions last 2 weeks, family md  dr polite aware no ekg done  . Sleep apnea     does not tolerate cpap  . Anxiety   . Arthritis   . Complication of anesthesia years ago    jittery and nervous after 1 d and c   Past Surgical History  Procedure Laterality Date  . Cholecystectomy    . Cesarean section      x 1  . Dilation and curettage of uterus      2 or 3 times  . Surgery for leg fracture Right   . Tubal ligation    . Colonoscopy with propofol N/A 09/14/2014    Procedure: COLONOSCOPY WITH PROPOFOL;  Surgeon: Charolett Bumpers, MD;  Location: [facility];  Service: Endoscopy;  Laterality:  N/A;   No family history on file. History  Substance Use Topics  . Smoking status: Never Smoker   . Smokeless tobacco: Not on file  . Alcohol Use: Yes     Comment: occasional wine   OB History    No data available      Review of Systems  Musculoskeletal: Positive for arthralgias (left knee pain).  All other systems reviewed and are negative.   Allergies  Review of patient's allergies indicates no known allergies.  Home Medications   Prior to Admission medications   Medication Sig Start Date End Date Taking? Authorizing Provider  acetaminophen (TYLENOL) 500 MG tablet Take 1,000 mg by mouth every 6 (six) hours as needed for moderate pain.   Yes Historical Provider, MD  FLUoxetine (PROZAC) 40 MG capsule Take 40 mg by mouth every morning.    Yes Historical Provider, MD  insulin glargine (LANTUS) 100 UNIT/ML injection Inject 20 Units into the skin every morning.   Yes Historical Provider, MD  metoprolol (LOPRESSOR) 50 MG tablet Take 50 mg by mouth every morning.    Yes Historical Provider, MD  Multiple Vitamin (MULTIVITAMIN WITH MINERALS) TABS tablet Take 1 tablet by mouth every morning.   Yes Historical Provider, MD  omeprazole (PRILOSEC) 10 MG capsule Take 10 mg by mouth every morning.  Yes Historical Provider, MD  ramipril (ALTACE) 10 MG capsule Take 10 mg by mouth every morning.    Yes Historical Provider, MD  sitaGLIPtin (JANUVIA) 100 MG tablet Take 100 mg by mouth every morning.   Yes Historical Provider, MD  acetaminophen-codeine (TYLENOL #3) 300-30 MG per tablet Take 1 tablet by mouth every 6 (six) hours as needed for severe pain (Do not take in addition to tylenol). 05/13/15   Antony Madura, PA-C  diclofenac sodium (VOLTAREN) 1 % GEL Apply 4 g topically 4 (four) times daily. 05/13/15   Antony Madura, PA-C   Triage Vitals: BP 137/81 mmHg  Pulse 55  Temp(Src) 98.9 F (37.2 C) (Oral)  Resp 16  SpO2 97%  Physical Exam  Constitutional: She is oriented to person, place, and  time. She appears well-developed and well-nourished. No distress.  HENT:  Head: Normocephalic and atraumatic.  Eyes: Conjunctivae and EOM are normal. No scleral icterus.  Neck: Normal range of motion.  Cardiovascular: Normal rate, regular rhythm and intact distal pulses.   DP and PT pulses 2+ in the left lower extremity.  Pulmonary/Chest: Effort normal. No respiratory distress.  Musculoskeletal: Normal range of motion. She exhibits tenderness.       Left knee: She exhibits normal range of motion, no swelling, no effusion, no deformity, no erythema, normal alignment, no LCL laxity, no bony tenderness and no MCL laxity. Tenderness found. Medial joint line and lateral joint line tenderness noted.  Normal active and passive range of motion of the right knee. Pain is worsened with knee flexion. Patient has 5/5 strength against resistance with knee flexion and extension against resistance. Tenderness noted along the medial and lateral joint lines without bony deformity or crepitus. No effusion noted. No swelling, erythema, or heat to touch. No knee instability or laxity noted.  Neurological: She is alert and oriented to person, place, and time. She exhibits normal muscle tone. Coordination normal.  Sensation to light touch intact in the left lower extremity. Patient ambulatory with antalgic gait.  Skin: Skin is warm and dry. No rash noted. She is not diaphoretic. No erythema. No pallor.  Psychiatric: She has a normal mood and affect. Her behavior is normal.  Nursing note and vitals reviewed.   ED Course  Procedures (including critical care time)  DIAGNOSTIC STUDIES: Oxygen Saturation is 97% on RA, normal by my interpretation.    COORDINATION OF CARE: 10:59 PM- Discussed plans to order diagnostic imaging of left knee. Pt advised of plan for treatment and pt agrees.  11:36 PM- Discussed imaging results with pt. Will refer pt to sports medicine. Will apply ACE wrap to help stabilize right knee.  Recommended pt to ice and elevate right leg. Will give pt Tramadol and topical gel to apply to right knee.  Labs Review Labs Reviewed - No data to display  Imaging Review Dg Knee Complete 4 Views Left  05/13/2015   CLINICAL DATA:  69 year old female with left knee pain.  EXAM: LEFT KNEE - COMPLETE 4+ VIEW  COMPARISON:  None.  FINDINGS: There is no evidence of fracture, dislocation, or joint effusion. There is no evidence of arthropathy or other focal bone abnormality. Soft tissues are unremarkable.  IMPRESSION: Unremarkable left knee radiograph.   Electronically Signed   By: Elgie Collard M.D.   On: 05/13/2015 23:31     EKG Interpretation None     MDM   Final diagnoses:  Left knee pain    69 year old female presents to the emergency department for evaluation  of left knee pain. Patient is neurovascularly intact. No evidence of septic joint. No history of trauma or injury to knee. X-ray negative for fracture, dislocation, or bony deformity. Symptoms suspected to be secondary to osteoarthritis versus meniscal injury. There is no significant knee laxity. Will refer patient to sports medicine for further evaluation of symptoms. Will manage as outpatient with Voltaren gel, Ace wrap, icing, elevation, and Tylenol 3 for severe pain as needed. Return precautions discussed and provided. Patient discharged in good condition with no unaddressed concerns.  I personally performed the services described in this documentation, which was scribed in my presence. The recorded information has been reviewed and is accurate.   Filed Vitals:   05/13/15 2232  BP: 137/81  Pulse: 55  Temp: 98.9 F (37.2 C)  TempSrc: Oral  Resp: 16  SpO2: 97%     Antony Madura, PA-C 05/14/15 0021  Tilden Fossa, MD 05/14/15 [phone]

## 2015-05-13 NOTE — Discharge Instructions (Signed)
You have been prescribed Tylenol #3 rather than Tramadol as long term use of Tramadol may be problematic with Prozac/Fluoxetine. Do not take Tylenol #3 with additional tylenol/acetaminophen tablets. Use an ACE wrap for stability. Try to ice your knee 3-4 times per day and elevate it as much as you are able. Follow up with a sports medicine doctor for further evaluation of your symptoms.  Arthralgia Your caregiver has diagnosed you as suffering from an arthralgia. Arthralgia means there is pain in a joint. This can come from many reasons including:  Bruising the joint which causes soreness (inflammation) in the joint.  Wear and tear on the joints which occur as we grow older (osteoarthritis).  Overusing the joint.  Various forms of arthritis.  Infections of the joint. Regardless of the cause of pain in your joint, most of these different pains respond to anti-inflammatory drugs and rest. The exception to this is when a joint is infected, and these cases are treated with antibiotics, if it is a bacterial infection. HOME CARE INSTRUCTIONS   Rest the injured area for as long as directed by your caregiver. Then slowly start using the joint as directed by your caregiver and as the pain allows. Crutches as directed may be useful if the ankles, knees or hips are involved. If the knee was splinted or casted, continue use and care as directed. If an stretchy or elastic wrapping bandage has been applied today, it should be removed and re-applied every 3 to 4 hours. It should not be applied tightly, but firmly enough to keep swelling down. Watch toes and feet for swelling, bluish discoloration, coldness, numbness or excessive pain. If any of these problems (symptoms) occur, remove the ace bandage and re-apply more loosely. If these symptoms persist, contact your caregiver or return to this location.  For the first 24 hours, keep the injured extremity elevated on pillows while lying down.  Apply ice for  15-20 minutes to the sore joint every couple hours while awake for the first half day. Then 03-04 times per day for the first 48 hours. Put the ice in a plastic bag and place a towel between the bag of ice and your skin.  Wear any splinting, casting, elastic bandage applications, or slings as instructed.  Only take over-the-counter or prescription medicines for pain, discomfort, or fever as directed by your caregiver. Do not use aspirin immediately after the injury unless instructed by your physician. Aspirin can cause increased bleeding and bruising of the tissues.  If you were given crutches, continue to use them as instructed and do not resume weight bearing on the sore joint until instructed. Persistent pain and inability to use the sore joint as directed for more than 2 to 3 days are warning signs indicating that you should see a caregiver for a follow-up visit as soon as possible. Initially, a hairline fracture (break in bone) may not be evident on X-rays. Persistent pain and swelling indicate that further evaluation, non-weight bearing or use of the joint (use of crutches or slings as instructed), or further X-rays are indicated. X-rays may sometimes not show a small fracture until a week or 10 days later. Make a follow-up appointment with your own caregiver or one to whom we have referred you. A radiologist (specialist in reading X-rays) may read your X-rays. Make sure you know how you are to obtain your X-ray results. Do not assume everything is normal if you do not hear from [place]. SEEK MEDICAL CARE IF: Bruising, swelling,  or pain increases. SEEK IMMEDIATE MEDICAL CARE IF:   Your fingers or toes are numb or blue.  The pain is not responding to medications and continues to stay the same or get worse.  The pain in your joint becomes severe.  You develop a fever over 102 F (38.9 C).  It becomes impossible to move or use the joint. MAKE SURE YOU:   Understand these instructions.  Will  watch your condition.  Will get help right away if you are not doing well or get worse. Document Released: 10/09/2005 Document Revised: 01/01/2012 Document Reviewed: 05/27/2008 [facility] Patient Information 2015 [place], [place]. This information is not intended to replace advice given to you by your health care provider. Make sure you discuss any questions you have with your health care provider.  RICE: Routine Care for Injuries The routine care of many injuries includes Rest, Ice, Compression, and Elevation (RICE). HOME CARE INSTRUCTIONS  Rest is needed to allow your body to heal. Routine activities can usually be resumed when comfortable. Injured tendons and bones can take up to 6 weeks to heal. Tendons are the cord-like structures that attach muscle to bone.  Ice following an injury helps keep the swelling down and reduces pain.  Put ice in a plastic bag.  Place a towel between your skin and the bag.  Leave the ice on for 15-20 minutes, 3-4 times a day, or as directed by your health care provider. Do this while awake, for the first 24 to 48 hours. After that, continue as directed by your caregiver.  Compression helps keep swelling down. It also gives support and helps with discomfort. If an elastic bandage has been applied, it should be removed and reapplied every 3 to 4 hours. It should not be applied tightly, but firmly enough to keep swelling down. Watch fingers or toes for swelling, bluish discoloration, coldness, numbness, or excessive pain. If any of these problems occur, remove the bandage and reapply loosely. Contact your caregiver if these problems continue.  Elevation helps reduce swelling and decreases pain. With extremities, such as the arms, hands, legs, and feet, the injured area should be placed near or above the level of the heart, if possible. SEEK IMMEDIATE MEDICAL CARE IF:  You have persistent pain and swelling.  You develop redness, numbness, or unexpected  weakness.  Your symptoms are getting worse rather than improving after several days. These symptoms may indicate that further evaluation or further X-rays are needed. Sometimes, X-rays may not show a small broken bone (fracture) until 1 week or 10 days later. Make a follow-up appointment with your caregiver. Ask when your X-ray results will be ready. Make sure you get your X-ray results. Document Released: 01/21/2001 Document Revised: 10/14/2013 Document Reviewed: 03/10/2011 [facility] Patient Information 2015 [place], [place]. This information is not intended to replace advice given to you by your health care provider. Make sure you discuss any questions you have with your health care provider.

## 2015-05-13 NOTE — ED Notes (Signed)
Patient reports left knee pain x 3 days.  No known injury.  Pain has gradually increased, making it difficult to ambulate.

## 2015-05-17 ENCOUNTER — Encounter: Payer: Self-pay | Admitting: Family Medicine

## 2015-05-17 ENCOUNTER — Ambulatory Visit (INDEPENDENT_AMBULATORY_CARE_PROVIDER_SITE_OTHER): Payer: Medicare Other | Admitting: Family Medicine

## 2015-05-17 VITALS — BP 124/83 | HR 79 | Ht 59.0 in | Wt 220.0 lb

## 2015-05-17 DIAGNOSIS — M25562 Pain in left knee: Secondary | ICD-10-CM

## 2015-05-17 NOTE — Patient Instructions (Signed)
These are the general arthritis instructions: Take tylenol  1-2 tabs three times a day for pain. Aleve 1-2 tabs twice a day with food OR the voltaren gel you were prescribed. Glucosamine sulfate  twice a day is a supplement that may help. Capsaicin, aspercreme, or biofreeze topically up to four times a day may also help with pain. Cortisone injections are an option. If cortisone injections do not help, there are different types of shots that may help but they take longer to take effect. It's important that you continue to stay active. Straight leg raises, knee extensions 3 sets of 10 once a day (add ankle weight if these become too easy). Consider physical therapy to strengthen muscles around the joint that hurts to take pressure off of the joint itself. Shoe inserts with good arch support may be helpful. Walker or cane if needed. Heat or ice 15 minutes at a time 3-4 times a day as needed to help with pain. Water aerobics and cycling with low resistance are the best two types of exercise for arthritis. Follow up will depend on the MRI results.

## 2015-05-19 DIAGNOSIS — M25562 Pain in left knee: Secondary | ICD-10-CM | POA: Insufficient documentation

## 2015-05-19 NOTE — Assessment & Plan Note (Signed)
radiographs non weight bearing do not show arthritis though would expect this to be most likely explanation of her pain.  Meniscus tear other consideration.  Discussed options and she would like to go ahead with MRI as next step.  Discussed tylenol, nsaids, glucosamine, topical medications, injection, physical therapy.  Treatment will depend on MRI results.

## 2015-05-19 NOTE — Progress Notes (Addendum)
PCP: Katy Apo, MD  Subjective:   HPI: Patient is a 69 y.o. female here for left knee pain.  Patient reports she's had about 4 months of left knee pain. Pain level 7/10. Right knee with some pain as well but only 3/10 level. Pain anterior knee. Was severe on Thursday. No new injuries - had right knee fracture in 2005. Using a cane, taking tylenol #3. Radiographs of left knee negative and no evidence of arthropathy. Getting clicking but no locking, giving out, catching.  Past Medical History  Diagnosis Date  . Diabetes mellitus   . GERD (gastroesophageal reflux disease)   . Hypertension   . Heart palpitations     heart palpitaions last 2 weeks, family md  dr polite aware no ekg done  . Sleep apnea     does not tolerate cpap  . Anxiety   . Arthritis   . Complication of anesthesia years ago    jittery and nervous after 1 d and c    Current Outpatient Prescriptions on File Prior to Visit  Medication Sig Dispense Refill  . acetaminophen-codeine (TYLENOL #3) 300-30 MG per tablet Take 1 tablet by mouth every 6 (six) hours as needed for severe pain (Do not take in addition to tylenol). 15 tablet 0  . diclofenac sodium (VOLTAREN) 1 % GEL Apply 4 g topically 4 (four) times daily. 100 g 1  . FLUoxetine (PROZAC) 40 MG capsule Take 40 mg by mouth every morning.     . insulin glargine (LANTUS) 100 UNIT/ML injection Inject 20 Units into the skin every morning.    . metoprolol (LOPRESSOR) 50 MG tablet Take 50 mg by mouth every morning.     . Multiple Vitamin (MULTIVITAMIN WITH MINERALS) TABS tablet Take 1 tablet by mouth every morning.    . ramipril (ALTACE) 10 MG capsule Take 10 mg by mouth every morning.     . sitaGLIPtin (JANUVIA) 100 MG tablet Take 100 mg by mouth every morning.     No current facility-administered medications on file prior to visit.    Past Surgical History  Procedure Laterality Date  . Cholecystectomy    . Cesarean section      x 1  . Dilation and  curettage of uterus      2 or 3 times  . Surgery for leg fracture Right   . Tubal ligation    . Colonoscopy with propofol N/A 09/14/2014    Procedure: COLONOSCOPY WITH PROPOFOL;  Surgeon: Charolett Bumpers, MD;  Location: [facility];  Service: Endoscopy;  Laterality: N/A;    No Known Allergies  History   Social History  . Marital Status: Married    Spouse Name: N/A  . Number of Children: N/A  . Years of Education: N/A   Occupational History  . Not on file.   Social History Main Topics  . Smoking status: Never Smoker   . Smokeless tobacco: Not on file  . Alcohol Use: 0.0 oz/week    0 Standard drinks or equivalent per week     Comment: occasional wine  . Drug Use: No  . Sexual Activity: Not on file   Other Topics Concern  . Not on file   Social History Narrative    No family history on file.  BP 124/83 mmHg  Pulse 79  Ht  (1.499 m)  Wt 220 lb (99.791 kg)  BMI 44.41 kg/m2  Review of Systems: See HPI above.    Objective:  Physical Exam:  Gen:  NAD  Left knee: No gross deformity, ecchymoses.  Minimal effusion TTP medial > lateral joint lines. FROM. Negative ant/post drawers.  Negative valgus/varus testing. Negative lachmanns. Pain with mcmurrays, apleys.  Negative patellar apprehension. NV intact distally.  Right knee: No gross deformity, ecchymoses, swelling. Mild medial, lateral joint line tenderness. FROM. Negative ant/post drawers. Negative valgus/varus testing. Negative lachmanns. Negative mcmurrays, apleys, patellar apprehension. NV intact distally.    Assessment & Plan:  1. Left knee pain - radiographs non weight bearing do not show arthritis though would expect this to be most likely explanation of her pain.  Meniscus tear other consideration.  Discussed options and she would like to go ahead with MRI as next step.  Discussed tylenol, nsaids, glucosamine, topical medications, injection, physical therapy.  Treatment will depend on MRI  results.  Addendum:  MRI reviewed and discussed with patient.  She has avulsion of posterior horn of medial meniscus despite telling me she actually feels improved from last visit.  Was asking more about right knee on phone - advised I would have to see her to evaluate this.  Discussed I'm not optimistic she will improve with conservative measures for this though can consider physical therapy, injection.  She is going to see how she feels over next 1-2 weeks and call us if she wants to proceed with any type of intervention.

## 2015-05-22 ENCOUNTER — Ambulatory Visit (HOSPITAL_BASED_OUTPATIENT_CLINIC_OR_DEPARTMENT_OTHER)
Admission: RE | Admit: 2015-05-22 | Discharge: 2015-05-22 | Disposition: A | Discharge status: Home / Self Care | Payer: Medicare Other | Source: Ambulatory Visit | Attending: Family Medicine | Admitting: Family Medicine

## 2015-05-22 DIAGNOSIS — M67462 Ganglion, left knee: Secondary | ICD-10-CM | POA: Insufficient documentation

## 2015-05-22 DIAGNOSIS — X58XXXA Exposure to other specified factors, initial encounter: Secondary | ICD-10-CM | POA: Diagnosis not present

## 2015-05-22 DIAGNOSIS — S83242A Other tear of medial meniscus, current injury, left knee, initial encounter: Secondary | ICD-10-CM | POA: Insufficient documentation

## 2015-05-22 DIAGNOSIS — M25562 Pain in left knee: Secondary | ICD-10-CM

## 2015-05-22 DIAGNOSIS — M7122 Synovial cyst of popliteal space [Baker], left knee: Secondary | ICD-10-CM | POA: Diagnosis not present

## 2015-05-22 DIAGNOSIS — M7050 Other bursitis of knee, unspecified knee: Secondary | ICD-10-CM | POA: Insufficient documentation

## 2015-05-24 NOTE — Addendum Note (Signed)
Addended by: Lenda Kelp on: 05/24/2015 01:54 PM   Modules accepted: Kipp Brood

## 2015-07-30 ENCOUNTER — Ambulatory Visit (INDEPENDENT_AMBULATORY_CARE_PROVIDER_SITE_OTHER): Payer: Medicare Other | Admitting: Family Medicine

## 2015-07-30 ENCOUNTER — Ambulatory Visit (HOSPITAL_BASED_OUTPATIENT_CLINIC_OR_DEPARTMENT_OTHER)
Admission: RE | Admit: 2015-07-30 | Discharge: 2015-07-30 | Disposition: A | Discharge status: Home / Self Care | Payer: Medicare Other | Source: Ambulatory Visit | Attending: Family Medicine | Admitting: Family Medicine

## 2015-07-30 ENCOUNTER — Encounter: Payer: Self-pay | Admitting: Family Medicine

## 2015-07-30 VITALS — BP 145/91 | HR 84 | Ht 59.0 in | Wt 224.0 lb

## 2015-07-30 DIAGNOSIS — M25562 Pain in left knee: Secondary | ICD-10-CM | POA: Insufficient documentation

## 2015-07-30 DIAGNOSIS — M79605 Pain in left leg: Secondary | ICD-10-CM

## 2015-07-30 MED ORDER — TIZANIDINE HCL 2 MG PO TABS: 2.0000 mg | ORAL_TABLET | Freq: Three times a day (TID) | ORAL | Status: DC | PRN

## 2015-07-30 NOTE — Patient Instructions (Addendum)
Given the pain in the back of the leg without a new injury I would recommend doing a doppler ultrasound to make sure you don't have a blood clot. Assuming this comes back ok you're dealing with calf and hamstring strain/spasms. I would take aleve 2 tabs twice a day with food for pain and inflammation for 7 days then as needed. Tizanidine as needed for spasms. Heat 15 minutes at a time 3-4 times a day for spasms. Start physical therapy for 1-2 visits to learn a home exercise program and get these muscles to heal and strengthen so they're not causing pain. Compression sleeve for comfort when up and walking around. Follow up with me in 1 month. I do not think any other imaging, an injection would help you given your current exam.

## 2015-08-03 DIAGNOSIS — M79605 Pain in left leg: Secondary | ICD-10-CM | POA: Insufficient documentation

## 2015-08-03 NOTE — Progress Notes (Signed)
PCP: Kandice Hams, MD  Subjective:   HPI: Patient is a 69 y.o. female here for left leg pain.  7/25: Patient reports she's had about 4 months of left knee pain. Pain level 7/10. Right knee with some pain as well but only 3/10 level. Pain anterior knee. Was severe on Thursday. No new injuries - had right knee fracture in 2005. Using a cane, taking tylenol #3. Radiographs of left knee negative and no evidence of arthropathy. Getting clicking but no locking, giving out, catching.  10/7: Patient reports for past 1 1/2 to 2 months she's had tightness feeling in posterior left leg - feels like 'streaks running up back of my leg' Pain level 5/10. Started with anterior knee pain (seen 2 1/2 months ago for this). Taking tylenol if needed. Pain more constant past month. Reports swelling in lower leg. No bruising, other skin changes, fever, shortness of breath, other complaints.  Past Medical History  Diagnosis Date  . Diabetes mellitus   . GERD (gastroesophageal reflux disease)   . Hypertension   . Heart palpitations     heart palpitaions last 2 weeks, family md  dr polite aware no ekg done  . Sleep apnea     does not tolerate cpap  . Anxiety   . Arthritis   . Complication of anesthesia years ago    jittery and nervous after 1 d and c    Current Outpatient Prescriptions on File Prior to Visit  Medication Sig Dispense Refill  . ACCU-CHEK SMARTVIEW test strip     . acetaminophen-codeine (TYLENOL #3) 300-30 MG per tablet Take 1 tablet by mouth every 6 (six) hours as needed for severe pain (Do not take in addition to tylenol). 15 tablet 0  . AIMSCO INSULIN SYR ULTRA THIN 31G X 5/16" 0.3 ML MISC     . Blood Glucose Monitoring Suppl (ACCU-CHEK NANO SMARTVIEW) W/DEVICE KIT     . diclofenac sodium (VOLTAREN) 1 % GEL Apply 4 g topically 4 (four) times daily. 100 g 1  . FLUoxetine (PROZAC) 40 MG capsule Take 40 mg by mouth every morning.     . insulin glargine (LANTUS) 100 UNIT/ML  injection Inject 20 Units into the skin every morning.    Elmore Guise Devices (SIMPLE DIAGNOSTICS LANCING DEV) MISC     . metoprolol (LOPRESSOR) 50 MG tablet Take 50 mg by mouth every morning.     . Multiple Vitamin (MULTIVITAMIN WITH MINERALS) TABS tablet Take 1 tablet by mouth every morning.    Marland Kitchen omeprazole (PRILOSEC) 20 MG capsule     . ramipril (ALTACE) 10 MG capsule Take 10 mg by mouth every morning.     . sitaGLIPtin (JANUVIA) 100 MG tablet Take 100 mg by mouth every morning.     No current facility-administered medications on file prior to visit.    Past Surgical History  Procedure Laterality Date  . Cholecystectomy    . Cesarean section      x 1  . Dilation and curettage of uterus      2 or 3 times  . Surgery for leg fracture Right   . Tubal ligation    . Colonoscopy with propofol N/A 09/14/2014    Procedure: COLONOSCOPY WITH PROPOFOL;  Surgeon: Garlan Fair, MD;  Location: [facility];  Service: Endoscopy;  Laterality: N/A;    No Known Allergies  Social History   Social History  . Marital Status: Married    Spouse Name: N/A  . Number of Children: N/A  .  Years of Education: N/A   Occupational History  . Not on file.   Social History Main Topics  . Smoking status: Never Smoker   . Smokeless tobacco: Not on file  . Alcohol Use: 0.0 oz/week    0 Standard drinks or equivalent per week     Comment: occasional wine  . Drug Use: No  . Sexual Activity: Not on file   Other Topics Concern  . Not on file   Social History Narrative    No family history on file.  BP 145/91 mmHg  Pulse 84  Ht 4' 11"  (1.499 m)  Wt 224 lb (101.606 kg)  BMI 45.22 kg/m2  Review of Systems: See HPI above.    Objective:  Physical Exam:  Gen: NAD  Left leg: No gross deformity, ecchymoses, effusion of knee.  1+ pitting edema TTP proximal calf, popliteal fossa, distal hamstring medially. FROM. Negative ant/post drawers.  Negative valgus/varus testing. Negative lachmanns. No  pain with mcmurrays, apleys.  Negative patellar apprehension. NV intact distally.  Right leg: No gross deformity, ecchymoses.  1+ pitting edema FROM without pain of knee    Assessment & Plan:  1. Left leg pain - Doppler u/s negative for DVT.  Consistent with overuse strains of hamstring and calf muscles.  Has underlying meniscus tear but exam negative for this as source of pain currently.  Aleve with tizanidine for spasms.  Heat as well.  Start physical therapy and home exercises.  Compression sleeve.  F/u in 1 month.

## 2015-08-03 NOTE — Assessment & Plan Note (Signed)
Doppler u/s negative for DVT.  Consistent with overuse strains of hamstring and calf muscles.  Has underlying meniscus tear but exam negative for this as source of pain currently.  Aleve with tizanidine for spasms.  Heat as well.  Start physical therapy and home exercises.  Compression sleeve.  F/u in 1 month.

## 2015-08-31 ENCOUNTER — Ambulatory Visit: Payer: Medicare Other | Admitting: Family Medicine

## 2015-11-03 IMAGING — CR DG KNEE COMPLETE 4+V*L*
4 series · 4 of 4 positions shown · non-contrast
Comparison: None.

CLINICAL DATA: 69-year-old female with left knee pain.

EXAM:
LEFT KNEE - COMPLETE 4+ VIEW

[t knee ap left]
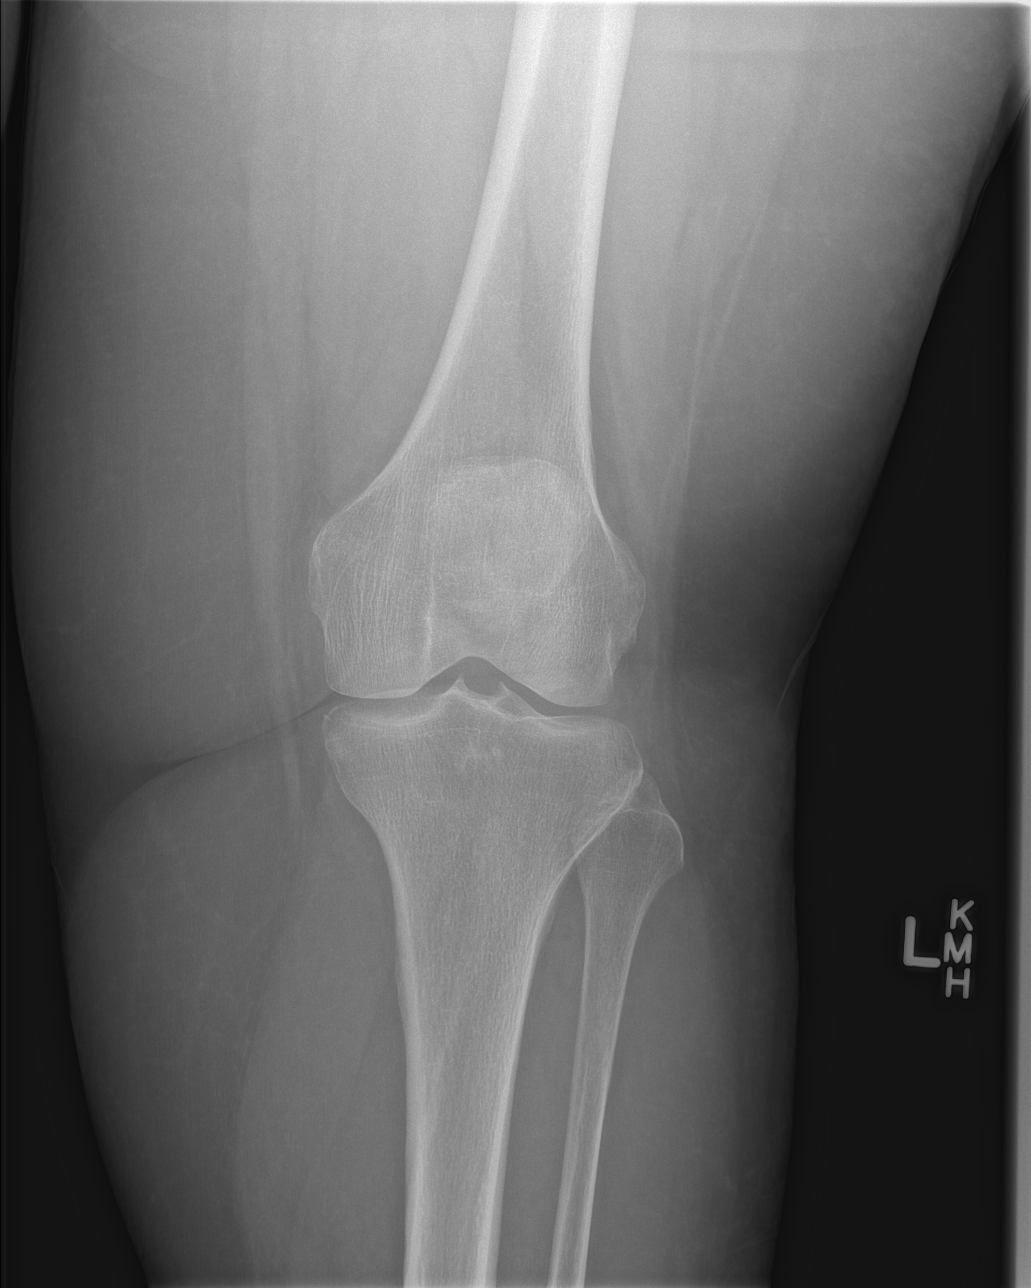

[t knee obl left (1 of 2)]
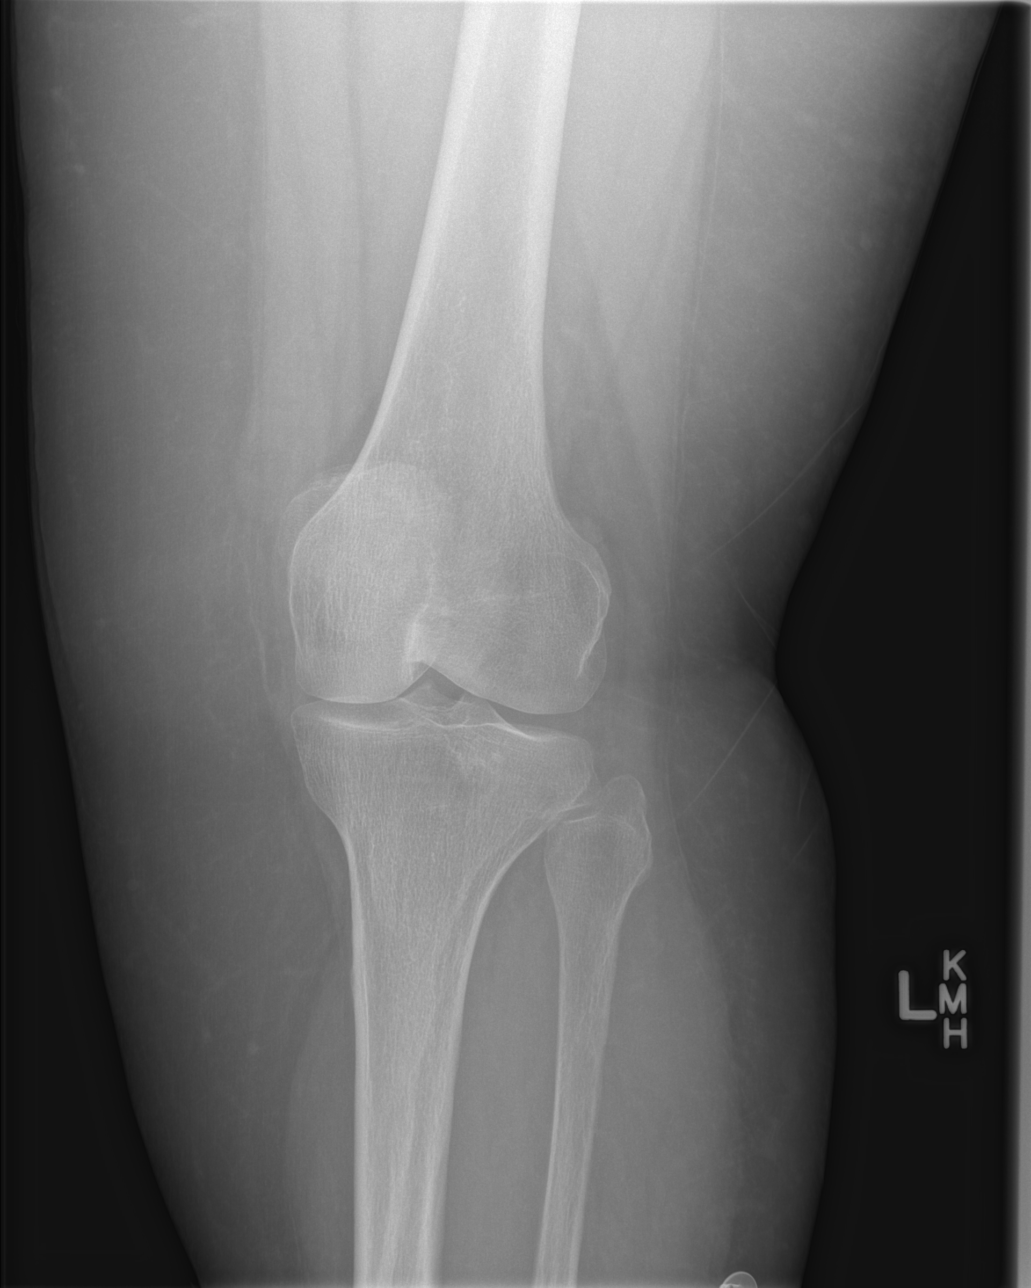

[t knee obl left (2 of 2)]
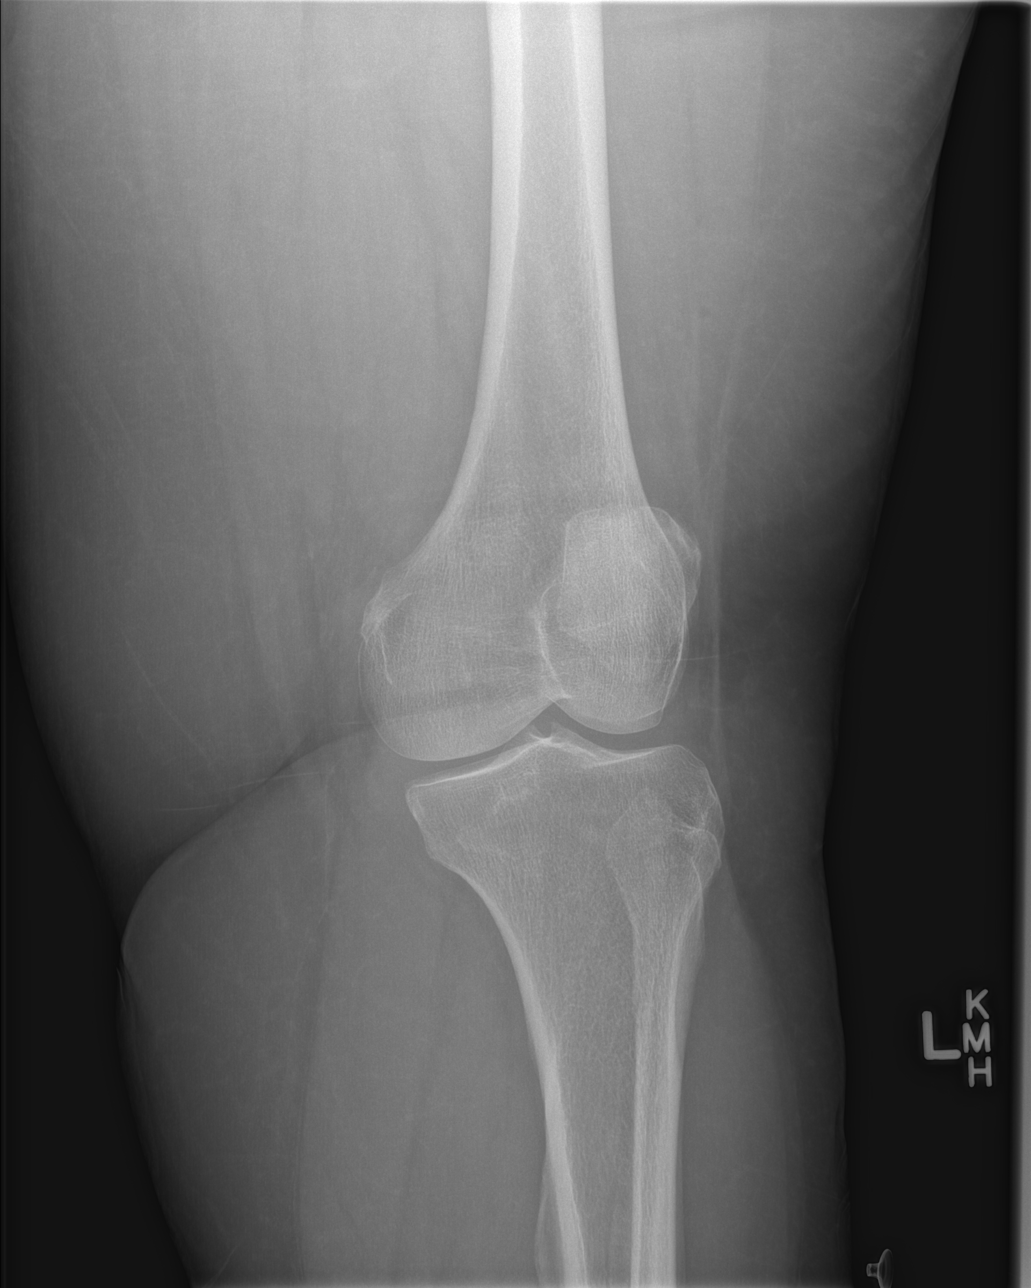

[t knee lat left]
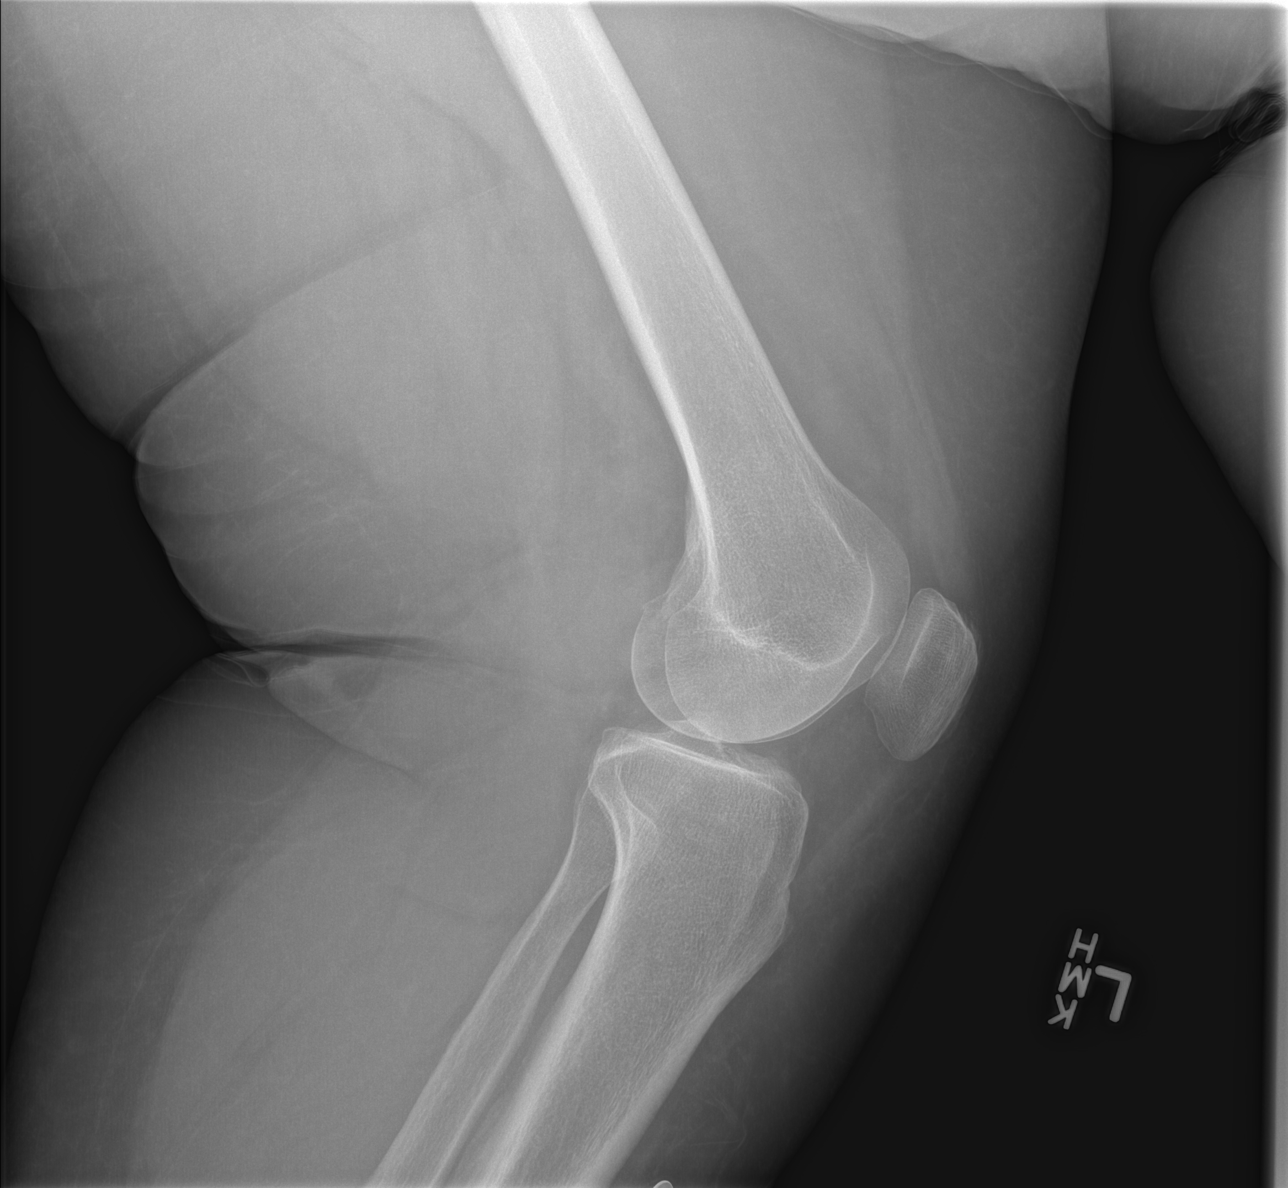

[4 of 4 positions shown; findings below may reference images not displayed]

FINDINGS: There is no evidence of fracture, dislocation, or joint effusion.
There is no evidence of arthropathy or other focal bone abnormality.
Soft tissues are unremarkable.
IMPRESSION: Unremarkable left knee radiograph.

## 2016-05-17 ENCOUNTER — Other Ambulatory Visit: Payer: Self-pay | Admitting: Gastroenterology

## 2016-05-17 DIAGNOSIS — R140 Abdominal distension (gaseous): Secondary | ICD-10-CM

## 2016-05-23 ENCOUNTER — Ambulatory Visit
Admission: RE | Admit: 2016-05-23 | Discharge: 2016-05-23 | Disposition: A | Discharge status: Home / Self Care | Payer: Medicare Other | Source: Ambulatory Visit | Attending: Gastroenterology | Admitting: Gastroenterology

## 2016-05-23 DIAGNOSIS — R140 Abdominal distension (gaseous): Secondary | ICD-10-CM

## 2016-07-30 ENCOUNTER — Emergency Department (HOSPITAL_COMMUNITY): Payer: Medicare Other

## 2016-07-30 ENCOUNTER — Emergency Department (HOSPITAL_COMMUNITY)
Admission: EM | Admit: 2016-07-30 | Discharge: 2016-07-30 | Disposition: A | Discharge status: Home / Self Care | Payer: Medicare Other | Attending: Emergency Medicine | Admitting: Emergency Medicine

## 2016-07-30 ENCOUNTER — Encounter (HOSPITAL_COMMUNITY): Payer: Self-pay | Admitting: Nurse Practitioner

## 2016-07-30 DIAGNOSIS — Z79899 Other long term (current) drug therapy: Secondary | ICD-10-CM | POA: Insufficient documentation

## 2016-07-30 DIAGNOSIS — E119 Type 2 diabetes mellitus without complications: Secondary | ICD-10-CM | POA: Insufficient documentation

## 2016-07-30 DIAGNOSIS — M79601 Pain in right arm: Secondary | ICD-10-CM | POA: Diagnosis not present

## 2016-07-30 DIAGNOSIS — I10 Essential (primary) hypertension: Secondary | ICD-10-CM | POA: Insufficient documentation

## 2016-07-30 DIAGNOSIS — Z794 Long term (current) use of insulin: Secondary | ICD-10-CM | POA: Diagnosis not present

## 2016-07-30 NOTE — Discharge Instructions (Signed)
1. Continue home medications. 2. Start taking Ibuprofen 400-600 mg three times daily. 3.  Follow up with orthopedics if your symptoms persist.

## 2016-07-30 NOTE — ED Provider Notes (Signed)
[facility] DEPT Provider Note   CSN: [id number] Arrival date & time: 07/30/16  1834  By signing my name below, I, Julien Nordmann, attest that this documentation has been prepared under the direction and in the presence of [employer], PA-C.  Electronically Signed: Julien Nordmann, ED Scribe. 07/30/16. 8:36 PM.    History   Chief Complaint Chief Complaint  Patient presents with  . Arm Pain    Right arm    The history is provided by the patient. No language interpreter was used.   HPI Comments: [name] is a 70 y.o. female who presents to the Emergency Department complaining of sudden onset, gradual worsening, moderate upper right arm pain x 2 days. Pt notes she may have popped her right arm when she was reaching behind her. She expresses increased pain when lifting or grabbing heavy objects. Pt also states she is unable to move her arm "properly". She has taken advil one time to alleviate her pain without relief.  Pt denies numbness, weakness, swelling.   Past Medical History:  Diagnosis Date  . Anxiety   . Arthritis   . Complication of anesthesia years ago   jittery and nervous after 1 d and c  . Diabetes mellitus   . GERD (gastroesophageal reflux disease)   . Heart palpitations    heart palpitaions last 2 weeks, family md  dr polite aware no ekg done  . Hypertension   . Sleep apnea    does not tolerate cpap    Patient Active Problem List   Diagnosis Date Noted  . Left leg pain 08/03/2015  . Left knee pain 05/19/2015    Past Surgical History:  Procedure Laterality Date  . CESAREAN SECTION     x 1  . CHOLECYSTECTOMY    . COLONOSCOPY WITH PROPOFOL N/A 09/14/2014   Procedure: COLONOSCOPY WITH PROPOFOL;  Surgeon: Garlan Fair, MD;  Location: [facility];  Service: Endoscopy;  Laterality: N/A;  . DILATION AND CURETTAGE OF UTERUS     2 or 3 times  . surgery for leg fracture Right   . TUBAL LIGATION      OB History    No data available       Home  Medications    Prior to Admission medications   Medication Sig Start Date End Date Taking? Authorizing Provider  ACCU-CHEK SMARTVIEW test strip  04/19/15   Historical Provider, MD  acetaminophen-codeine (TYLENOL #3) 300-30 MG per tablet Take 1 tablet by mouth every 6 (six) hours as needed for severe pain (Do not take in addition to tylenol). 05/13/15   Antonietta Breach, PA-C  AIMSCO INSULIN SYR ULTRA THIN 31G X 5/16" 0.3 ML MISC  04/19/15   Historical Provider, MD  Blood Glucose Monitoring Suppl (ACCU-CHEK NANO SMARTVIEW) W/DEVICE KIT  04/19/15   Historical Provider, MD  diclofenac sodium (VOLTAREN) 1 % GEL Apply 4 g topically 4 (four) times daily. 05/13/15   Antonietta Breach, PA-C  FLUoxetine (PROZAC) 40 MG capsule Take 40 mg by mouth every morning.     Historical Provider, MD  insulin glargine (LANTUS) 100 UNIT/ML injection Inject 20 Units into the skin every morning.    Historical Provider, MD  Lancet Devices (SIMPLE DIAGNOSTICS LANCING DEV) MISC  04/19/15   Historical Provider, MD  metoprolol (LOPRESSOR) 50 MG tablet Take 50 mg by mouth every morning.     Historical Provider, MD  Multiple Vitamin (MULTIVITAMIN WITH MINERALS) TABS tablet Take 1 tablet by mouth every morning.  Historical Provider, MD  omeprazole (PRILOSEC) 20 MG capsule  05/14/15   Historical Provider, MD  ramipril (ALTACE) 10 MG capsule Take 10 mg by mouth every morning.     Historical Provider, MD  sitaGLIPtin (JANUVIA) 100 MG tablet Take 100 mg by mouth every morning.    Historical Provider, MD  tiZANidine (ZANAFLEX) 2 MG tablet Take 1 tablet (2 mg total) by mouth every 8 (eight) hours as needed for muscle spasms. 07/30/15   Dene Gentry, MD    Family History No family history on file.  Social History Social History  Substance Use Topics  . Smoking status: Never Smoker  . Smokeless tobacco: Not on file  . Alcohol use 0.0 oz/week     Comment: occasional wine     Allergies   Review of patient's allergies indicates no known  allergies.   Review of Systems Review of Systems  Musculoskeletal: Positive for arthralgias.  Neurological: Negative for weakness and numbness.  All other systems reviewed and are negative.    Physical Exam Updated Vital Signs BP 157/98 (BP Location: Right Arm)   Pulse 74   Temp 98.1 F (36.7 C) (Oral)   Resp 20   SpO2 98%   Physical Exam  Constitutional: She is oriented to person, place, and time. She appears well-developed and well-nourished.  HENT:  Head: Normocephalic and atraumatic.  Right Ear: External ear normal.  Left Ear: External ear normal.  Eyes: Conjunctivae are normal. No scleral icterus.  Neck: No tracheal deviation present.  Cardiovascular:  Pulses:      Radial pulses are 2+ on the right side, and 2+ on the left side.  Pulmonary/Chest: Effort normal. No respiratory distress.  Abdominal: She exhibits no distension.  Musculoskeletal: Normal range of motion. She exhibits tenderness.  Full active ROM of right shoulder, elbow, and wrist.  Mild tenderness over lateral epicondyle of right elbow.  Mild tenderness of distal humerus. No crepitus or deformity.   Neurological: She is alert and oriented to person, place, and time.  5/5 strength in bilateral shoulders, elbows, wrists, and grip strength.  Sensation intact throughout BUE.  Skin: Skin is warm and dry.  Psychiatric: She has a normal mood and affect. Her behavior is normal.  Nursing note and vitals reviewed.    ED Treatments / Results  DIAGNOSTIC STUDIES: Oxygen Saturation is 98% on RA, normal by my interpretation.  COORDINATION OF CARE:  8:36 PM Discussed treatment plan with pt at bedside and pt agreed to plan.  Labs (all labs ordered are listed, but only abnormal results are displayed) Labs Reviewed - No data to display  EKG  EKG Interpretation None       Radiology Dg Shoulder Right  Result Date: 07/30/2016 CLINICAL DATA:  Felt pop in right shoulder and upper arm, with right arm pain.  Initial encounter. EXAM: RIGHT SHOULDER - 2+ VIEW COMPARISON:  None. FINDINGS: There is no evidence of fracture or dislocation. The right humeral head is seated within the glenoid fossa. Degenerative change is noted at the right acromioclavicular joint. No significant soft tissue abnormalities are seen. The visualized portions of the right lung are clear. IMPRESSION: No evidence of fracture or dislocation. If the patient's symptoms persist, MRI could be considered for further evaluation to assess for internal derangement. Electronically Signed   By: Garald Balding M.D.   On: 07/30/2016 21:36   Dg Elbow Complete Right  Result Date: 07/30/2016 CLINICAL DATA:  Felt pop in right shoulder and upper arm, with worsening  right arm pain. Initial encounter. EXAM: RIGHT ELBOW - COMPLETE 3+ VIEW COMPARISON:  None. FINDINGS: There is no evidence of fracture or dislocation. The visualized joint spaces are preserved. No significant joint effusion is identified. The soft tissues are unremarkable in appearance. IMPRESSION: No evidence of fracture or dislocation. Electronically Signed   By: Garald Balding M.D.   On: 07/30/2016 21:39    Procedures Procedures (including critical care time)  Medications Ordered in ED Medications - No data to display   Initial Impression / Assessment and Plan / ED Course  I have reviewed the triage vital signs and the nursing notes.  Pertinent labs & imaging results that were available during my care of the patient were reviewed by me and considered in my medical decision making (see chart for details).  Clinical Course   Patient X-Ray negative for obvious fracture or dislocation.  Pt advised to follow up with orthopedics.  Conservative therapy, ice, NSAIDs, recommended and discussed. Patient will be discharged home & is agreeable with above plan. Returns precautions discussed. Pt appears safe for discharge.  I personally performed the services described in this documentation,  which was scribed in my presence. The recorded information has been reviewed and is accurate.  Final Clinical Impressions(s) / ED Diagnoses   Final diagnoses:  Right arm pain    New Prescriptions New Prescriptions   No medications on file     Vivien Rossetti 07/30/16 2147    Davonna Belling, MD 07/30/16 2334

## 2016-07-30 NOTE — ED Triage Notes (Signed)
Pt states she might have popped something in her right arm 2 days ago, c/o pain in and not being able to use it properly since then.

## 2016-11-18 ENCOUNTER — Ambulatory Visit (HOSPITAL_COMMUNITY)
Admission: EM | Admit: 2016-11-18 | Discharge: 2016-11-18 | Disposition: A | Discharge status: Home / Self Care | Payer: Medicare Other | Attending: Family Medicine | Admitting: Family Medicine

## 2016-11-18 ENCOUNTER — Encounter (HOSPITAL_COMMUNITY): Payer: Self-pay | Admitting: Emergency Medicine

## 2016-11-18 DIAGNOSIS — J00 Acute nasopharyngitis [common cold]: Secondary | ICD-10-CM | POA: Diagnosis not present

## 2016-11-18 DIAGNOSIS — H6501 Acute serous otitis media, right ear: Secondary | ICD-10-CM

## 2016-11-18 DIAGNOSIS — G44209 Tension-type headache, unspecified, not intractable: Secondary | ICD-10-CM | POA: Diagnosis not present

## 2016-11-18 MED ORDER — AZITHROMYCIN 250 MG PO TABS: 250.0000 mg | ORAL_TABLET | Freq: Every day | ORAL | 0 refills | Status: DC

## 2016-11-18 MED ORDER — IPRATROPIUM BROMIDE 0.06 % NA SOLN: 2.0000 | Freq: Four times a day (QID) | NASAL | 0 refills | Status: DC

## 2016-11-18 MED ORDER — METHYLPREDNISOLONE 4 MG PO TBPK: ORAL_TABLET | ORAL | 0 refills | Status: DC

## 2016-11-18 NOTE — ED Provider Notes (Signed)
CSN: [id number]     Arrival date & time 11/18/16  1542 History   First MD Initiated Contact with Patient 11/18/16 1716     Chief Complaint  Patient presents with  . URI   (Consider location/radiation/quality/duration/timing/severity/associated sxs/prior Treatment) Patient c/o fatigue, headache, nasal drainage and facial pressure.  She states last week she had right ear discomfort.  She has had sx's for a week.   The history is provided by the patient.  URI  Presenting symptoms: congestion, cough, ear pain, facial pain, fatigue and rhinorrhea   Severity:  Mild Onset quality:  Sudden Duration:  1 week Timing:  Constant Progression:  Worsening Chronicity:  New Relieved by:  Nothing Worsened by:  Nothing Associated symptoms: arthralgias     Past Medical History:  Diagnosis Date  . Anxiety   . Arthritis   . Complication of anesthesia years ago   jittery and nervous after 1 d and c  . Diabetes mellitus   . GERD (gastroesophageal reflux disease)   . Heart palpitations    heart palpitaions last 2 weeks, family md  dr polite aware no ekg done  . Hypertension   . Sleep apnea    does not tolerate cpap   Past Surgical History:  Procedure Laterality Date  . CESAREAN SECTION     x 1  . CHOLECYSTECTOMY    . COLONOSCOPY WITH PROPOFOL N/A 09/14/2014   Procedure: COLONOSCOPY WITH PROPOFOL;  Surgeon: Garlan Fair, MD;  Location: [facility];  Service: Endoscopy;  Laterality: N/A;  . DILATION AND CURETTAGE OF UTERUS     2 or 3 times  . surgery for leg fracture Right   . TUBAL LIGATION     History reviewed. No pertinent family history. Social History  Substance Use Topics  . Smoking status: Never Smoker  . Smokeless tobacco: Not on file  . Alcohol use 0.0 oz/week     Comment: occasional wine   OB History    No data available     Review of Systems  Constitutional: Positive for fatigue.  HENT: Positive for congestion, ear pain and rhinorrhea.   Eyes: Negative.    Respiratory: Positive for cough.   Cardiovascular: Negative.   Gastrointestinal: Negative.   Endocrine: Negative.   Genitourinary: Negative.   Musculoskeletal: Positive for arthralgias.  Allergic/Immunologic: Negative.   Neurological: Negative.   Hematological: Negative.     Allergies  Patient has no known allergies.  Home Medications   Prior to Admission medications   Medication Sig Start Date End Date Taking? Authorizing Provider  ACCU-CHEK SMARTVIEW test strip  04/19/15   Historical Provider, MD  acetaminophen-codeine (TYLENOL #3) 300-30 MG per tablet Take 1 tablet by mouth every 6 (six) hours as needed for severe pain (Do not take in addition to tylenol). 05/13/15   Antonietta Breach, PA-C  AIMSCO INSULIN SYR ULTRA THIN 31G X 5/16" 0.3 ML MISC  04/19/15   Historical Provider, MD  azithromycin (ZITHROMAX) 250 MG tablet Take 1 tablet (250 mg total) by mouth daily. Take first 2 tablets together, then 1 every day until finished. 11/18/16   Lysbeth Penner, FNP  Blood Glucose Monitoring Suppl (ACCU-CHEK NANO SMARTVIEW) W/DEVICE KIT  04/19/15   Historical Provider, MD  diclofenac sodium (VOLTAREN) 1 % GEL Apply 4 g topically 4 (four) times daily. 05/13/15   Antonietta Breach, PA-C  FLUoxetine (PROZAC) 40 MG capsule Take 40 mg by mouth every morning.     Historical Provider, MD  insulin glargine (LANTUS) 100 UNIT/ML  injection Inject 20 Units into the skin every morning.    Historical Provider, MD  ipratropium (ATROVENT) 0.06 % nasal spray Place 2 sprays into both nostrils 4 (four) times daily. 11/18/16   Lysbeth Penner, FNP  Lancet Devices (SIMPLE DIAGNOSTICS LANCING DEV) MISC  04/19/15   Historical Provider, MD  methylPREDNISolone (MEDROL DOSEPAK) 4 MG TBPK tablet Take 6-5-4-3-2-1 po qd 11/18/16   Lysbeth Penner, FNP  metoprolol (LOPRESSOR) 50 MG tablet Take 50 mg by mouth every morning.     Historical Provider, MD  Multiple Vitamin (MULTIVITAMIN WITH MINERALS) TABS tablet Take 1 tablet by mouth every  morning.    Historical Provider, MD  omeprazole (PRILOSEC) 20 MG capsule  05/14/15   Historical Provider, MD  ramipril (ALTACE) 10 MG capsule Take 10 mg by mouth every morning.     Historical Provider, MD  sitaGLIPtin (JANUVIA) 100 MG tablet Take 100 mg by mouth every morning.    Historical Provider, MD  tiZANidine (ZANAFLEX) 2 MG tablet Take 1 tablet (2 mg total) by mouth every 8 (eight) hours as needed for muscle spasms. Patient not taking: Reported on 11/18/2016 07/30/15   Dene Gentry, MD   Meds Ordered and Administered this Visit  Medications - No data to display  BP 142/88 (BP Location: Right Arm)   Pulse 69   Temp 98.4 F (36.9 C) (Oral)   Resp 20   SpO2 97%  No data found.   Physical Exam  Constitutional: She appears well-developed and well-nourished.  HENT:  Head: Normocephalic.  Right Ear: External ear normal.  Left Ear: External ear normal.  Mouth/Throat: Oropharynx is clear and moist.  Right TM erythematous  Eyes: Conjunctivae and EOM are normal. Pupils are equal, round, and reactive to light.  Neck: Normal range of motion. Neck supple.  Cardiovascular: Normal rate, regular rhythm and normal heart sounds.   Pulmonary/Chest: Effort normal and breath sounds normal.  Abdominal: Soft.  Nursing note and vitals reviewed.   Urgent Care Course     Procedures (including critical care time)  Labs Review Labs Reviewed - No data to display  Imaging Review No results found.   Visual Acuity Review  Right Eye Distance:   Left Eye Distance:   Bilateral Distance:    Right Eye Near:   Left Eye Near:    Bilateral Near:         MDM   1. Tension-type headache, not intractable, unspecified chronicity pattern   2. Acute nasopharyngitis   3. Right acute serous otitis media, recurrence not specified    Zpak Medrol dose pack Ipratropium nasal spray  Push po fluids, rest, tylenol and motrin otc prn as directed for fever, arthralgias, and myalgias.  Follow up  prn if sx's continue or persist.   Lysbeth Penner, FNP 11/18/16 1739

## 2016-11-18 NOTE — ED Triage Notes (Signed)
Complains of headache, generalized weakness/feeling bad.  Denies cough, cold, or runny nose.

## 2017-07-17 ENCOUNTER — Other Ambulatory Visit: Payer: Self-pay | Admitting: Geriatric Medicine

## 2017-07-17 DIAGNOSIS — R51 Headache: Secondary | ICD-10-CM

## 2017-07-17 DIAGNOSIS — R519 Headache, unspecified: Secondary | ICD-10-CM

## 2017-07-17 DIAGNOSIS — M542 Cervicalgia: Secondary | ICD-10-CM

## 2017-07-18 ENCOUNTER — Ambulatory Visit
Admission: RE | Admit: 2017-07-18 | Discharge: 2017-07-18 | Disposition: A | Discharge status: Home / Self Care | Payer: Medicare Other | Source: Ambulatory Visit | Attending: Geriatric Medicine | Admitting: Geriatric Medicine

## 2017-07-18 DIAGNOSIS — M542 Cervicalgia: Secondary | ICD-10-CM

## 2017-07-18 DIAGNOSIS — R519 Headache, unspecified: Secondary | ICD-10-CM

## 2017-07-18 DIAGNOSIS — R51 Headache: Secondary | ICD-10-CM

## 2017-07-18 MED ORDER — GADOBENATE DIMEGLUMINE 529 MG/ML IV SOLN
20.0000 mL | Freq: Once | INTRAVENOUS | Status: AC | PRN
  Administered 2017-07-18: 20 mL via INTRAVENOUS

## 2018-07-24 ENCOUNTER — Encounter: Payer: Self-pay | Admitting: Gastroenterology

## 2018-07-24 ENCOUNTER — Telehealth: Payer: Self-pay | Admitting: Gastroenterology

## 2018-07-24 NOTE — Telephone Encounter (Signed)
Left a voicemail to call back and schedule an office visit to discuss TIF procedure.

## 2018-07-30 ENCOUNTER — Ambulatory Visit: Payer: Medicare Other | Admitting: Gastroenterology

## 2018-08-06 ENCOUNTER — Encounter (INDEPENDENT_AMBULATORY_CARE_PROVIDER_SITE_OTHER): Payer: Self-pay

## 2018-08-06 ENCOUNTER — Ambulatory Visit (INDEPENDENT_AMBULATORY_CARE_PROVIDER_SITE_OTHER): Payer: Medicare Other | Admitting: Gastroenterology

## 2018-08-06 ENCOUNTER — Encounter: Payer: Self-pay | Admitting: Gastroenterology

## 2018-08-06 VITALS — BP 144/86 | HR 66 | Ht 59.0 in | Wt 226.2 lb

## 2018-08-06 DIAGNOSIS — R140 Abdominal distension (gaseous): Secondary | ICD-10-CM | POA: Diagnosis not present

## 2018-08-06 DIAGNOSIS — Z800 Family history of malignant neoplasm of digestive organs: Secondary | ICD-10-CM | POA: Diagnosis not present

## 2018-08-06 DIAGNOSIS — K449 Diaphragmatic hernia without obstruction or gangrene: Secondary | ICD-10-CM

## 2018-08-06 DIAGNOSIS — K219 Gastro-esophageal reflux disease without esophagitis: Secondary | ICD-10-CM

## 2018-08-06 NOTE — Patient Instructions (Addendum)
If you are age 70 or older, your body mass index should be between 23-30. Your Body mass index is 45.7 kg/m. If this is out of the aforementioned range listed, please consider follow up with your Primary Care Provider.  If you are age 88 or younger, your body mass index should be between 19-25. Your Body mass index is 45.7 kg/m. If this is out of the aformentioned range listed, please consider follow up with your Primary Care Provider.   You have been scheduled for an endoscopy. Please follow written instructions given to you at your visit today. If you use inhalers (even only as needed), please bring them with you on the day of your procedure. Your physician has requested that you go to www.startemmi.com and enter the access code given to you at your visit today. This web site gives a general overview about your procedure. However, you should still follow specific instructions given to you by our office regarding your preparation for the procedure.  Please purchase the following medications over the counter and take as directed: FDGard Take two capsules, two times a day. Take 30-60 minutes before meal(s) with water.  It was a pleasure to see you today!  Vito Cirigliano, D.O.

## 2018-08-06 NOTE — Progress Notes (Signed)
/             Chief Complaint: GERD   Referring Provider:    Self    HPI:     [name] is a 72 y.o. female referred to the [facility] for evaluation of GERD with specific interest in transoral incisionless fundoplication (TIF).  She states that she has a long-standing history of reflux for a number of years.  Index symptoms described as regurgitation, heartburn, and constant throat clearing.  Reflux symptoms worse at night/supine with nocturnal regurgitation.  Heartburn otherwise controlled with Prilosec 20 mg daily.  Occasionally takes a second dose at nighttime.  Otherwise, no antacids.  No change in throat clearing with PPI therapy.  She denies any dysphasia, although reports a previous history of EGD with dilation at outside facility 1 or 2 times in the past.  No record of any outside EGDs in our EMR and no records of dilation performed in our EMR.  Otherwise, reflux exacerbated by spicy foods.  She states she has ha d along standing hx of reflux for a number of years.  She previously followed with Dr. Sharlett Iles and Dr. Earle Gell at [facility], and more recently followed with [facility].   She also describes abdominal fullness and bloating that can occur throughout the day.  Symptoms tend to be postprandial, and can last for a few hours.  No associated nausea or vomiting.  No pain.  The symptoms have been going on for a number of years, with gastric emptying study performed in 2011 for the symptoms, which was normal.  CT in 2015 notable for small hiatal hernia.  No mention of hernia noted on previous EGD reports.  Family history notable for father with colon cancer diagnosed in his 7s.  Currently UTD on colon cancer screening, with next colonoscopy due in 08/2019.  Endoscopic history: - Colonoscopy (08/2014, Dr. Earle Gell; done for diverticulitis in September 2015): Left-sided diverticulosis: Otherwise normal with random biopsies negative for microscopic  colitis.  Recommended repeat in 5 years. -Colonoscopy (05/2011, Dr. Earle Gell): Left-sided diverticulosis, otherwise normal.  Recommended repeat in 10 years. - Gastric emptying study (09/2010; done for abdominal pain and bloating): Normal with 2% retention at 120 minutes. -EGD (05/2007): Normal  -EGD (03/2003): Normal - Colonoscopy (02/2003): Normal  Past Medical History:  Diagnosis Date  . Anxiety   . Arthritis   . Complication of anesthesia years ago   jittery and nervous after 1 d and c  . Diabetes mellitus   . GERD (gastroesophageal reflux disease)   . Heart palpitations    heart palpitaions last 2 weeks, family md  dr polite aware no ekg done  . Hypertension   . Sleep apnea    does not tolerate cpap     Past Surgical History:  Procedure Laterality Date  . CESAREAN SECTION     x 1  . CHOLECYSTECTOMY    . COLONOSCOPY WITH PROPOFOL N/A 09/14/2014   Procedure: COLONOSCOPY WITH PROPOFOL;  Surgeon: Garlan Fair, MD;  Location: [facility];  Service: Endoscopy;  Laterality: N/A;  . DILATION AND CURETTAGE OF UTERUS     2 or 3 times  . surgery for leg fracture Right   . TUBAL LIGATION     No family history on file. Social History   Tobacco Use  . Smoking status: Never Smoker  Substance Use Topics  . Alcohol use: Yes    Alcohol/week: 0.0 standard drinks    Comment: occasional wine  .  Drug use: No   Current Outpatient Medications  Medication Sig Dispense Refill  . ACCU-CHEK SMARTVIEW test strip     . acetaminophen-codeine (TYLENOL #3) 300-30 MG per tablet Take 1 tablet by mouth every 6 (six) hours as needed for severe pain (Do not take in addition to tylenol). 15 tablet 0  . AIMSCO INSULIN SYR ULTRA THIN 31G X 5/16" 0.3 ML MISC     . azithromycin (ZITHROMAX) 250 MG tablet Take 1 tablet (250 mg total) by mouth daily. Take first 2 tablets together, then 1 every day until finished. 6 tablet 0  . Blood Glucose Monitoring Suppl (ACCU-CHEK NANO SMARTVIEW) W/DEVICE KIT      . diclofenac sodium (VOLTAREN) 1 % GEL Apply 4 g topically 4 (four) times daily. 100 g 1  . FLUoxetine (PROZAC) 40 MG capsule Take 40 mg by mouth every morning.     . insulin glargine (LANTUS) 100 UNIT/ML injection Inject 20 Units into the skin every morning.    Marland Kitchen ipratropium (ATROVENT) 0.06 % nasal spray Place 2 sprays into both nostrils 4 (four) times daily. 15 mL 0  . Lancet Devices (SIMPLE DIAGNOSTICS LANCING DEV) MISC     . methylPREDNISolone (MEDROL DOSEPAK) 4 MG TBPK tablet Take 6-5-4-3-2-1 po qd 21 tablet 0  . metoprolol (LOPRESSOR) 50 MG tablet Take 50 mg by mouth every morning.     . Multiple Vitamin (MULTIVITAMIN WITH MINERALS) TABS tablet Take 1 tablet by mouth every morning.    Marland Kitchen omeprazole (PRILOSEC) 20 MG capsule     . ramipril (ALTACE) 10 MG capsule Take 10 mg by mouth every morning.     . sitaGLIPtin (JANUVIA) 100 MG tablet Take 100 mg by mouth every morning.    Marland Kitchen tiZANidine (ZANAFLEX) 2 MG tablet Take 1 tablet (2 mg total) by mouth every 8 (eight) hours as needed for muscle spasms. (Patient not taking: Reported on 11/18/2016) 60 tablet 0   No current facility-administered medications for this visit.    No Known Allergies   Review of Systems: All systems reviewed and negative except where noted in HPI.     Physical Exam:    Wt Readings from Last 3 Encounters:  07/30/15 224 lb (101.6 kg)  05/17/15 220 lb (99.8 kg)  09/14/14 230 lb (104.3 kg)    There were no vitals taken for this visit. Constitutional:  Pleasant, in no acute distress. Psychiatric: Normal mood and affect. Behavior is normal. EENT: Pupils normal.  Conjunctivae are normal. No scleral icterus. Neck supple. No cervical LAD. Cardiovascular: Normal rate, regular rhythm. No edema Pulmonary/chest: Effort normal and breath sounds normal. No wheezing, rales or rhonchi. Abdominal: Soft, nondistended, nontender. Bowel sounds active throughout. There are no masses palpable. No hepatomegaly. Neurological:  Alert and oriented to person place and time. Skin: Skin is warm and dry. No rashes noted.   ASSESSMENT AND PLAN;   [name] is a 72 y.o. female presenting with:  1) reflux, regurgitation: Long-standing history of both typical and atypical reflux symptoms which is improved but not controlled with daily acid suppression therapy.  We discussed her symptoms at length, to include pathophysiology of reflux, and will proceed as below: -EGD to evaluate for erosive esophagitis, LES laxity, hiatal hernia - Resume Prilosec 20 mg daily for now, with modification of therapy pending and endoscopic findings -Resume antireflux lifestyle measures -Discussed her elevated BMI (45) as pertaining to uncontrolled reflux as well as limitations of antireflux surgical intervention, with particular detail regarding TIF and decreased  success rates with BMI over 35.  Pending endoscopic findings, and if wanting to entertain antireflux surgical intervention, would be more appropriate to send general surgery/bariatric surgery - Pending endoscopic findings, if no objective evidence of reflux, will send for pH/manometry, likely off medications - We will plan for referral to ENT pending endoscopic findings for evaluation of constant throat clearing  2) abdominal bloating: Long-standing history of abdominal bloating which seems independent of reflux - Resume PPI for now -Start trial of FD guard -Low FODMAP diet  3) obesity: BMI greater than 35 precludes her as a candidate for TIF.  Discussed central adiposity as exacerbating factor for reflux  4) family history of colon cancer: Due for colonoscopy in 08/2019  The indications, risks, and benefits of EGD were explained to the patient in detail. Risks include but are not limited to bleeding, perforation, adverse reaction to medications, and cardiopulmonary compromise. Sequelae include but are not limited to the possibility of surgery, hositalization, and mortality.  The patient verbalized understanding and wished to proceed. All questions answered, referred to scheduler. Further recommendations pending results of the exam.    RTC in 2 months or sooner as needed   Lavena Bullion, DO, FACG  08/06/2018, 2:50 PM   Seward Carol, MD

## 2018-08-08 ENCOUNTER — Encounter: Payer: Self-pay | Admitting: Gastroenterology

## 2018-08-22 ENCOUNTER — Ambulatory Visit (AMBULATORY_SURGERY_CENTER): Payer: Medicare Other | Admitting: Gastroenterology

## 2018-08-22 ENCOUNTER — Encounter: Payer: Self-pay | Admitting: Gastroenterology

## 2018-08-22 VITALS — BP 115/77 | HR 72 | Temp 97.5°F | Resp 13 | Ht 59.0 in | Wt 226.0 lb

## 2018-08-22 DIAGNOSIS — K219 Gastro-esophageal reflux disease without esophagitis: Secondary | ICD-10-CM | POA: Diagnosis present

## 2018-08-22 DIAGNOSIS — K317 Polyp of stomach and duodenum: Secondary | ICD-10-CM | POA: Diagnosis not present

## 2018-08-22 DIAGNOSIS — K449 Diaphragmatic hernia without obstruction or gangrene: Secondary | ICD-10-CM

## 2018-08-22 MED ORDER — SODIUM CHLORIDE 0.9 % IV SOLN: 500.0000 mL | Freq: Once | INTRAVENOUS | Status: DC

## 2018-08-22 NOTE — Op Note (Signed)
[facility] Patient Name: [name] Procedure Date: 08/22/2018 9:46 AM MRN: [id number] Endoscopist: Doristine Locks , MD Age: 72 Referring MD:  Date of Birth: 1946/02/15 Gender: Female Account #: [id number] Procedure:                Upper GI endoscopy Indications:              Heartburn, Suspected esophageal reflux, Suspected                            hiatal hernia Medicines:                Monitored Anesthesia Care Procedure:                Pre-Anesthesia Assessment:                           - Prior to the procedure, a History and Physical                            was performed, and patient medications and                            allergies were reviewed. The patient's tolerance of                            previous anesthesia was also reviewed. The risks                            and benefits of the procedure and the sedation                            options and risks were discussed with the patient.                            All questions were answered, and informed consent                            was obtained. Prior Anticoagulants: The patient has                            taken no previous anticoagulant or antiplatelet                            agents. ASA Grade Assessment: III - A patient with                            severe systemic disease. After reviewing the risks                            and benefits, the patient was deemed in                            satisfactory condition to undergo the procedure.  After obtaining informed consent, the endoscope was                            passed under direct vision. Throughout the                            procedure, the patient's blood pressure, pulse, and                            oxygen saturations were monitored continuously. The                            Endoscope was introduced through the mouth, and                            advanced to the second part of  duodenum. The upper                            GI endoscopy was accomplished without difficulty.                            The patient tolerated the procedure well. Scope In: Scope Out: Findings:                 Esophagogastric landmarks were identified: the                            Z-line was found at 35 cm, the gastroesophageal                            junction was found at 35 cm and the site of hiatal                            narrowing was found at 38 cm from the incisors.                           A 3 cm hiatal hernia was present.                           The Z-line was regular and was found 35 cm from the                            incisors.                           The gastroesophageal flap valve was visualized                            endoscopically and classified as Hill Grade III                            (minimal fold, loose to endoscope, hiatal hernia  likely) with a 3 cm transverse width hiatal hernia                            noted on retroflexed views.                           The upper third of the esophagus and middle third                            of the esophagus were normal.                           A few small sessile polyps with no stigmata of                            recent bleeding were found in the gastric fundus                            and in the gastric body. Several of these polyps                            were removed with a cold biopsy forceps for                            histologic representative evaluation. Resection and                            retrieval were complete. Estimated blood loss was                            minimal.                           Normal mucosa was found in the entire examined                            stomach.                           The ampulla, duodenal bulb, first portion of the                            duodenum and second portion of the duodenum were                             normal. Complications:            No immediate complications. Estimated Blood Loss:     Estimated blood loss was minimal. Impression:               - Esophagogastric landmarks identified.                           - 3 cm hiatal hernia.                           -  Z-line regular, 35 cm from the incisors.                           - Gastroesophageal flap valve classified as Hill                            Grade III (minimal fold, loose to endoscope, hiatal                            hernia likely).                           - Normal upper third of esophagus and middle third                            of esophagus.                           - A few gastric polyps. Resected and retrieved.                           - Normal mucosa was found in the entire stomach.                           - Normal ampulla, duodenal bulb, first portion of                            the duodenum and second portion of the duodenum. Recommendation:           - Patient has a contact number available for                            emergencies. The signs and symptoms of potential                            delayed complications were discussed with the                            patient. Return to normal activities tomorrow.                            Written discharge instructions were provided to the                            patient.                           - Resume previous diet today.                           - Continue present medications.                           - Await pathology results.                           -  Perform Esophageal Manometry and ambulatory                            pH/Impedance monitoring at appointment to be                            scheduled. Recommend this OFF PPI therapy.                           - Return to GI clinic after studies are complete. Doristine Locks, MD 08/22/2018 10:08:07 AM

## 2018-08-22 NOTE — Progress Notes (Signed)
Called to room to assist during endoscopic procedure.  Patient ID and intended procedure confirmed with present staff. Received instructions for my participation in the procedure from the performing physician.  

## 2018-08-22 NOTE — Patient Instructions (Signed)
Handout given on GERD protocol/anti-reflux   YOU HAD AN ENDOSCOPIC PROCEDURE TODAY AT THE [facility]:   Refer to the procedure report that was given to you for any specific questions about what was found during the examination.  If the procedure report does not answer your questions, please call your gastroenterologist to clarify.  If you requested that your care partner not be given the details of your procedure findings, then the procedure report has been included in a sealed envelope for you to review at your convenience later.  YOU SHOULD EXPECT: Some feelings of bloating in the abdomen. Passage of more gas than usual.  Walking can help get rid of the air that was put into your GI tract during the procedure and reduce the bloating. If you had a lower endoscopy (such as a colonoscopy or flexible sigmoidoscopy) you may notice spotting of blood in your stool or on the toilet paper. If you underwent a bowel prep for your procedure, you may not have a normal bowel movement for a few days.  Please Note:  You might notice some irritation and congestion in your nose or some drainage.  This is from the oxygen used during your procedure.  There is no need for concern and it should clear up in a day or so.  SYMPTOMS TO REPORT IMMEDIATELY:    Following upper endoscopy (EGD)  Vomiting of blood or coffee ground material  New chest pain or pain under the shoulder blades  Painful or persistently difficult swallowing  New shortness of breath  Fever of 100F or higher  Black, tarry-looking stools  For urgent or emergent issues, a gastroenterologist can be reached at any hour by calling [phone].   DIET:  We do recommend a small meal at first, but then you may proceed to your regular diet.  Drink plenty of fluids but you should avoid alcoholic beverages for 24 hours.  ACTIVITY:  You should plan to take it easy for the rest of today and you should NOT DRIVE or use heavy machinery until  tomorrow (because of the sedation medicines used during the test).    FOLLOW UP: Our staff will call the number listed on your records the next business day following your procedure to check on you and address any questions or concerns that you may have regarding the information given to you following your procedure. If we do not reach you, we will leave a message.  However, if you are feeling well and you are not experiencing any problems, there is no need to return our call.  We will assume that you have returned to your regular daily activities without incident.  If any biopsies were taken you will be contacted by phone or by letter within the next 1-3 weeks.  Please call us at [phone] if you have not heard about the biopsies in 3 weeks.    SIGNATURES/CONFIDENTIALITY: You and/or your care partner have signed paperwork which will be entered into your electronic medical record.  These signatures attest to the fact that that the information above on your After Visit Summary has been reviewed and is understood.  Full responsibility of the confidentiality of this discharge information lies with you and/or your care-partner.

## 2018-08-22 NOTE — Progress Notes (Signed)
To PACU, VSS. Report to [email or web address] 

## 2018-08-22 NOTE — Progress Notes (Signed)
Pt's states no medical or surgical changes since previsit or office visit. 

## 2018-08-23 ENCOUNTER — Telehealth: Payer: Self-pay

## 2018-08-23 ENCOUNTER — Other Ambulatory Visit: Payer: Self-pay

## 2018-08-23 NOTE — Telephone Encounter (Signed)
  Follow up Call-  Call back number 08/22/2018  Post procedure Call Back phone  # [phone]  Permission to leave phone message Yes  Some recent data might be hidden     Patient questions:  Do you have a fever, pain , or abdominal swelling? No. Pain Score  0 *  Have you tolerated food without any problems? Yes.    Have you been able to return to your normal activities? Yes.    Do you have any questions about your discharge instructions: Diet   No. Medications  No. Follow up visit  No.  Do you have questions or concerns about your Care? No.  Actions: * If pain score is 4 or above: No action needed, pain <4.

## 2018-08-23 NOTE — Telephone Encounter (Signed)
Pt scheduled for esophageal mano with ph impedence 09/16/18 at 10:30 with [facility]. Pt informed to arrive at [facility] at 10:00am.   Instructions mailed and pt to call if she has any questions.

## 2018-08-27 ENCOUNTER — Encounter: Payer: Self-pay | Admitting: Gastroenterology

## 2018-09-12 ENCOUNTER — Telehealth: Payer: Self-pay | Admitting: Gastroenterology

## 2018-09-12 NOTE — Telephone Encounter (Signed)
Patient wants to cx procedure scheduled for Monday at the hospital states that she does not want to get this done right now

## 2018-09-16 ENCOUNTER — Ambulatory Visit (HOSPITAL_COMMUNITY): Admission: RE | Admit: 2018-09-16 | Payer: Medicare Other | Source: Ambulatory Visit | Admitting: Gastroenterology

## 2018-09-16 ENCOUNTER — Encounter (HOSPITAL_COMMUNITY): Admission: RE | Payer: Self-pay | Source: Ambulatory Visit

## 2018-09-16 SURGERY — MANOMETRY, ESOPHAGUS

## 2019-10-23 ENCOUNTER — Other Ambulatory Visit: Payer: Self-pay

## 2019-10-23 DIAGNOSIS — Z20822 Contact with and (suspected) exposure to covid-19: Secondary | ICD-10-CM

## 2019-10-25 LAB — NOVEL CORONAVIRUS, NAA: SARS-CoV-2, NAA: NOT DETECTED

## 2020-02-04 ENCOUNTER — Other Ambulatory Visit: Payer: Self-pay | Admitting: Internal Medicine

## 2020-02-04 ENCOUNTER — Ambulatory Visit
Admission: RE | Admit: 2020-02-04 | Discharge: 2020-02-04 | Disposition: A | Discharge status: Home / Self Care | Payer: Medicare Other | Source: Ambulatory Visit | Attending: Internal Medicine | Admitting: Internal Medicine

## 2020-02-04 DIAGNOSIS — R0601 Orthopnea: Secondary | ICD-10-CM

## 2020-02-13 ENCOUNTER — Encounter: Payer: Self-pay | Admitting: Cardiology

## 2020-02-13 ENCOUNTER — Other Ambulatory Visit: Payer: Self-pay

## 2020-02-13 ENCOUNTER — Encounter (HOSPITAL_COMMUNITY): Payer: Self-pay | Admitting: Emergency Medicine

## 2020-02-13 ENCOUNTER — Emergency Department (HOSPITAL_COMMUNITY): Payer: Medicare Other

## 2020-02-13 ENCOUNTER — Emergency Department (HOSPITAL_COMMUNITY)
Admission: EM | Admit: 2020-02-13 | Discharge: 2020-02-14 | Disposition: A | Discharge status: Home / Self Care | Payer: Medicare Other | Attending: Emergency Medicine | Admitting: Emergency Medicine

## 2020-02-13 DIAGNOSIS — Z79899 Other long term (current) drug therapy: Secondary | ICD-10-CM | POA: Insufficient documentation

## 2020-02-13 DIAGNOSIS — R0602 Shortness of breath: Secondary | ICD-10-CM | POA: Diagnosis not present

## 2020-02-13 DIAGNOSIS — I10 Essential (primary) hypertension: Secondary | ICD-10-CM | POA: Insufficient documentation

## 2020-02-13 DIAGNOSIS — Z794 Long term (current) use of insulin: Secondary | ICD-10-CM | POA: Insufficient documentation

## 2020-02-13 DIAGNOSIS — E119 Type 2 diabetes mellitus without complications: Secondary | ICD-10-CM | POA: Insufficient documentation

## 2020-02-13 LAB — CBC
HCT: 39.5 % (ref 36.0–46.0)
Hemoglobin: 12.9 g/dL (ref 12.0–15.0)
MCH: 27.9 pg (ref 26.0–34.0)
MCHC: 32.7 g/dL (ref 30.0–36.0)
MCV: 85.3 fL (ref 80.0–100.0)
Platelets: 170 10*3/uL (ref 150–400)
RBC: 4.63 MIL/uL (ref 3.87–5.11)
RDW: 13.3 % (ref 11.5–15.5)
WBC: 7.3 10*3/uL (ref 4.0–10.5)
nRBC: 0 % (ref 0.0–0.2)

## 2020-02-13 LAB — BASIC METABOLIC PANEL
Anion gap: 10 (ref 5–15)
BUN: 9 mg/dL (ref 8–23)
CO2: 29 mmol/L (ref 22–32)
Calcium: 8.9 mg/dL (ref 8.9–10.3)
Chloride: 101 mmol/L (ref 98–111)
Creatinine, Ser: 0.73 mg/dL (ref 0.44–1.00)
GFR calc Af Amer: >60 mL/min (ref >60)
GFR calc non Af Amer: >60 mL/min (ref >60)
Glucose, Bld: 284 mg/dL — ABNORMAL HIGH (ref 70–99)
Potassium: 4 mmol/L (ref 3.5–5.1)
Sodium: 140 mmol/L (ref 135–145)

## 2020-02-13 LAB — TROPONIN I (HIGH SENSITIVITY)
Troponin I (High Sensitivity): 12 ng/L (ref <18)
Troponin I (High Sensitivity): 11 ng/L (ref <18)

## 2020-02-13 LAB — D-DIMER, QUANTITATIVE: D-Dimer, Quant: 1.05 ug/mL-FEU — ABNORMAL HIGH (ref 0.00–0.50)

## 2020-02-13 MED ORDER — SODIUM CHLORIDE 0.9% FLUSH: 3.0000 mL | Freq: Once | INTRAVENOUS | Status: DC

## 2020-02-13 NOTE — ED Triage Notes (Signed)
Pt c/o shortness of breath and chest heaviness x 2 weeks. Sent by her doctor today for elevated d-dimer. Verbal orders for labs from [place], [place].

## 2020-02-14 ENCOUNTER — Emergency Department (HOSPITAL_COMMUNITY): Payer: Medicare Other

## 2020-02-14 ENCOUNTER — Emergency Department (HOSPITAL_COMMUNITY)
Admission: EM | Admit: 2020-02-14 | Discharge: 2020-02-14 | Disposition: A | Discharge status: Home / Self Care | Payer: Medicare Other | Source: Home / Self Care | Attending: Emergency Medicine | Admitting: Emergency Medicine

## 2020-02-14 ENCOUNTER — Other Ambulatory Visit: Payer: Self-pay

## 2020-02-14 DIAGNOSIS — E876 Hypokalemia: Secondary | ICD-10-CM

## 2020-02-14 DIAGNOSIS — R609 Edema, unspecified: Secondary | ICD-10-CM

## 2020-02-14 DIAGNOSIS — R600 Localized edema: Secondary | ICD-10-CM

## 2020-02-14 DIAGNOSIS — R0602 Shortness of breath: Secondary | ICD-10-CM | POA: Diagnosis not present

## 2020-02-14 LAB — CBC WITH DIFFERENTIAL/PLATELET
Abs Immature Granulocytes: 0.04 10*3/uL (ref 0.00–0.07)
Basophils Absolute: 0.1 10*3/uL (ref 0.0–0.1)
Basophils Relative: 1 %
Eosinophils Absolute: 0.1 10*3/uL (ref 0.0–0.5)
Eosinophils Relative: 2 %
HCT: 39.1 % (ref 36.0–46.0)
Hemoglobin: 13 g/dL (ref 12.0–15.0)
Immature Granulocytes: 1 %
Lymphocytes Relative: 37 %
Lymphs Abs: 2.2 10*3/uL (ref 0.7–4.0)
MCH: 28.5 pg (ref 26.0–34.0)
MCHC: 33.2 g/dL (ref 30.0–36.0)
MCV: 85.7 fL (ref 80.0–100.0)
Monocytes Absolute: 0.5 10*3/uL (ref 0.1–1.0)
Monocytes Relative: 8 %
Neutro Abs: 3.1 10*3/uL (ref 1.7–7.7)
Neutrophils Relative %: 51 %
Platelets: 168 10*3/uL (ref 150–400)
RBC: 4.56 MIL/uL (ref 3.87–5.11)
RDW: 13.2 % (ref 11.5–15.5)
WBC: 6.1 10*3/uL (ref 4.0–10.5)
nRBC: 0 % (ref 0.0–0.2)

## 2020-02-14 LAB — BASIC METABOLIC PANEL
Anion gap: 8 (ref 5–15)
BUN: 12 mg/dL (ref 8–23)
CO2: 27 mmol/L (ref 22–32)
Calcium: 8.7 mg/dL — ABNORMAL LOW (ref 8.9–10.3)
Chloride: 103 mmol/L (ref 98–111)
Creatinine, Ser: 0.58 mg/dL (ref 0.44–1.00)
GFR calc Af Amer: >60 mL/min (ref >60)
GFR calc non Af Amer: >60 mL/min (ref >60)
Glucose, Bld: 251 mg/dL — ABNORMAL HIGH (ref 70–99)
Potassium: 3 mmol/L — ABNORMAL LOW (ref 3.5–5.1)
Sodium: 138 mmol/L (ref 135–145)

## 2020-02-14 LAB — BRAIN NATRIURETIC PEPTIDE: B Natriuretic Peptide: 76 pg/mL (ref 0.0–100.0)

## 2020-02-14 LAB — D-DIMER, QUANTITATIVE: D-Dimer, Quant: 1.14 ug/mL-FEU — ABNORMAL HIGH (ref 0.00–0.50)

## 2020-02-14 MED ORDER — IOHEXOL 350 MG/ML SOLN
100.0000 mL | Freq: Once | INTRAVENOUS | Status: AC | PRN
  Administered 2020-02-14: 100 mL via INTRAVENOUS

## 2020-02-14 MED ORDER — SODIUM CHLORIDE (PF) 0.9 % IJ SOLN
INTRAMUSCULAR | Status: AC
  Filled 2020-02-14: qty 50

## 2020-02-14 MED ORDER — POTASSIUM CHLORIDE CRYS ER 20 MEQ PO TBCR
40.0000 meq | EXTENDED_RELEASE_TABLET | Freq: Once | ORAL | Status: AC
  Administered 2020-02-14: 18:00:00 40 meq via ORAL
  Filled 2020-02-14: qty 2

## 2020-02-14 MED ORDER — POTASSIUM CHLORIDE CRYS ER 20 MEQ PO TBCR
20.0000 meq | EXTENDED_RELEASE_TABLET | Freq: Two times a day (BID) | ORAL | 0 refills | Status: AC
Start: 2020-02-14 — End: ?

## 2020-02-14 NOTE — ED Provider Notes (Signed)
Answered phone call from triage.  They are requesting guidance on labs ordered for patient who presented to ED from PCP for elevated D-dimer.  She has no records of her elevated D-dimer and there are none available in care everywhere or in her medical records.  I recommended chest pain work-up labs including dimer.  I recommended inform the patient that if it is elevated she will need further evaluation.   Solon Augusta [place], [place] 02/14/20 0805    Sabas Sous, MD 02/16/20 [phone]

## 2020-02-14 NOTE — ED Notes (Signed)
Pt states she is leaving ED and will get results via MyChart.

## 2020-02-14 NOTE — ED Provider Notes (Signed)
[place] DEPT Provider Note   CSN: [id number] Arrival date & time: 02/14/20  1532     History No chief complaint on file.   [name] is a 74 y.o. female.  HPI She presents for ongoing shortness of breath for several weeks.  She saw her PCP yesterday, and had testing done including cardiac echo and D-dimer.  D-dimer was elevated, so she was sent here for evaluation.  She went to [facility] ED yesterday but left prior to completion of treatment.  Her doctor put her on Lasix about 10 days ago to treat "fluid."  She is not taking potassium.  She denies fever, chills, cough, chest pain, nausea, vomiting, weakness or dizziness.  No prior similar problem.  There are no other known modifying factors.    Past Medical History:  Diagnosis Date  . Anemia   . Anxiety   . Arthritis   . Complication of anesthesia years ago   jittery and nervous after 1 d and c  . Diabetes mellitus    type 2  . Esophageal stricture   . GERD (gastroesophageal reflux disease)   . Heart palpitations    heart palpitaions last 2 weeks, family md  dr polite aware no ekg done  . Hypertension   . Sleep apnea    does not tolerate cpap  . UTI (urinary tract infection)     Patient Active Problem List   Diagnosis Date Noted  . Left leg pain 08/03/2015  . Left knee pain 05/19/2015    Past Surgical History:  Procedure Laterality Date  . CESAREAN SECTION     x 1  . CHOLECYSTECTOMY    . COLONOSCOPY WITH PROPOFOL N/A 09/14/2014   Procedure: COLONOSCOPY WITH PROPOFOL;  Surgeon: Garlan Fair, MD;  Location: [facility] ENDOSCOPY;  Service: Endoscopy;  Laterality: N/A;  . DILATION AND CURETTAGE OF UTERUS     2 or 3 times  . ESOPHAGOGASTRODUODENOSCOPY     said she had one many years ago but can't remember the year  . surgery for leg fracture Right   . TUBAL LIGATION       OB History   No obstetric history on file.     Family History  Problem Relation Age of Onset  . Diabetes  Mother   . Ovarian cancer Mother   . Colon cancer Father   . Diabetes Father   . Diabetes Sister   . Diabetes Sister   . Diabetes Sister   . Diabetes Sister   . Esophageal cancer Neg Hx   . Breast cancer Neg Hx     Social History   Tobacco Use  . Smoking status: Never Smoker  . Smokeless tobacco: Never Used  Substance Use Topics  . Alcohol use: Yes    Alcohol/week: 0.0 standard drinks    Comment: occasional wine  . Drug use: No    Home Medications Prior to Admission medications   Medication Sig Start Date End Date Taking? Authorizing Provider  ACCU-CHEK SMARTVIEW test strip  04/19/15   [provider]  AIMSCO INSULIN SYR ULTRA THIN 31G X 5/16" 0.3 ML MISC  04/19/15   [provider]  Blood Glucose Monitoring Suppl (ACCU-CHEK NANO SMARTVIEW) W/DEVICE KIT  04/19/15   [provider]  FLUoxetine (PROZAC) 40 MG capsule Take 40 mg by mouth every morning.     [provider]  insulin glargine (LANTUS) 100 UNIT/ML injection Inject 40 Units into the skin every morning.  [provider]  Lancet Devices (SIMPLE DIAGNOSTICS LANCING DEV) MISC  04/19/15   [provider]  losartan (COZAAR) 25 MG tablet  06/26/18   [provider]  metoprolol (LOPRESSOR) 50 MG tablet Take 50 mg by mouth every morning.     [provider]  omeprazole (PRILOSEC) 20 MG capsule  05/14/15   [provider]  potassium chloride SA (KLOR-CON) 20 MEQ tablet Take 1 tablet (20 mEq total) by mouth 2 (two) times daily. 02/14/20   Daleen Bo, MD  sitaGLIPtin (JANUVIA) 100 MG tablet Take 100 mg by mouth every morning.    [provider]    Allergies    Patient has no known allergies.  Review of Systems   Review of Systems  All other systems reviewed and are negative.   Physical Exam Updated Vital Signs BP (!) 153/96 (BP Location: Left Arm)   Pulse 70   Temp 99.2 F (37.3 C) (Oral)   Resp 18   Ht 5' (1.524 m)   Wt 105.2  kg   SpO2 94%   BMI 45.31 kg/m   Physical Exam Vitals and nursing note reviewed.  Constitutional:      General: She is not in acute distress.    Appearance: She is well-developed. She is obese. She is not ill-appearing, toxic-appearing or diaphoretic.  HENT:     Head: Normocephalic and atraumatic.     Right Ear: External ear normal.     Left Ear: External ear normal.  Eyes:     Conjunctiva/sclera: Conjunctivae normal.     Pupils: Pupils are equal, round, and reactive to light.  Neck:     Trachea: Phonation normal.  Cardiovascular:     Rate and Rhythm: Normal rate and regular rhythm.     Heart sounds: Normal heart sounds.  Pulmonary:     Effort: Pulmonary effort is normal. No respiratory distress.     Breath sounds: Normal breath sounds. No stridor. No wheezing or rhonchi.  Abdominal:     Palpations: Abdomen is soft.     Tenderness: There is no abdominal tenderness.  Musculoskeletal:        General: Normal range of motion.     Cervical back: Normal range of motion and neck supple.  Skin:    General: Skin is warm and dry.  Neurological:     Mental Status: She is alert and oriented to person, place, and time.     Cranial Nerves: No cranial nerve deficit.     Sensory: No sensory deficit.     Motor: No abnormal muscle tone.     Coordination: Coordination normal.  Psychiatric:        Mood and Affect: Mood normal.        Behavior: Behavior normal.        Thought Content: Thought content normal.        Judgment: Judgment normal.     ED Results / Procedures / Treatments   Labs (all labs ordered are listed, but only abnormal results are displayed) Labs Reviewed  D-DIMER, QUANTITATIVE (NOT AT [facility]) - Abnormal; Notable for the following components:      Result Value   D-Dimer, Quant 1.14 (*)    All other components within normal limits  BASIC METABOLIC PANEL - Abnormal; Notable for the following components:   Potassium 3.0 (*)    Glucose, Bld 251 (*)    Calcium 8.7 (*)      All other components within normal limits  CBC WITH  DIFFERENTIAL/PLATELET  BRAIN NATRIURETIC PEPTIDE    EKG None  Radiology DG Chest 2 View  Result Date: 02/13/2020 CLINICAL DATA:  74 year old female with chest pain and shortness of breath. EXAM: CHEST - 2 VIEW COMPARISON:  Chest radiograph dated 02/04/2028. FINDINGS: The lungs are clear. There is no pleural effusion pneumothorax. The cardiac silhouette is within limits. No acute osseous pathology. Cholecystectomy clips. IMPRESSION: No active cardiopulmonary disease. Electronically Signed   By: Anner Crete M.D.   On: 02/13/2020 20:56   CT Angio Chest PE W/Cm &/Or Wo Cm  Result Date: 02/14/2020 CLINICAL DATA:  Shortness of breath, elevated D-dimer EXAM: CT ANGIOGRAPHY CHEST WITH CONTRAST TECHNIQUE: Multidetector CT imaging of the chest was performed using the standard protocol during bolus administration of intravenous contrast. Multiplanar CT image reconstructions and MIPs were obtained to evaluate the vascular anatomy. CONTRAST:  123m OMNIPAQUE IOHEXOL 350 MG/ML SOLN COMPARISON:  None. FINDINGS: Cardiovascular: Satisfactory opacification of the pulmonary arteries to the segmental level. No evidence of pulmonary embolism. Cardiomegaly. Enlargement of the main pulmonary artery up to 3.5 cm. No pericardial effusion. Aortic atherosclerosis. Mediastinum/Nodes: No enlarged mediastinal, hilar, or axillary lymph nodes. Thyroid gland, trachea, and esophagus demonstrate no significant findings. Lungs/Pleura: Lungs are clear. No pleural effusion or pneumothorax. Upper Abdomen: No acute abnormality. Musculoskeletal: No chest wall abnormality. No acute or significant osseous findings. Review of the MIP images confirms the above findings. IMPRESSION: 1.  Negative examination for pulmonary embolism. 2.  Cardiomegaly. 3. Enlargement of the main pulmonary artery, as can be seen in pulmonary hypertension. 4.  Aortic Atherosclerosis (ICD10-I70.0).  Electronically Signed   By: AEddie CandleM.D.   On: 02/14/2020 17:28    Procedures Procedures (including critical care time)  Medications Ordered in ED Medications  sodium chloride (PF) 0.9 % injection (has no administration in time range)  sodium chloride (PF) 0.9 % injection (has no administration in time range)  potassium chloride SA (KLOR-CON) CR tablet 40 mEq (has no administration in time range)  iohexol (OMNIPAQUE) 350 MG/ML injection 100 mL (100 mLs Intravenous Contrast Given 02/14/20 1709)    ED Course  I have reviewed the triage vital signs and the nursing notes.  Pertinent labs & imaging results that were available during my care of the patient were reviewed by me and considered in my medical decision making (see chart for details).  Clinical Course as of Feb 13 1802  Sat Feb 14, 2020  1736 No PE, enlarged pulmonary artery  CT Angio Chest PE W/Cm &/Or Wo Cm [EW]  1736 Normal  Brain natriuretic peptide [EW]  1736 Elevated  [facility] [EW]  1736 Normal except potassium low, glucose, calcium low  Basic metabolic panel(!) [EW]  12979Normal  CBC WITH DIFFERENTIAL [EW]    Clinical Course User Index [EW] WDaleen Bo MD   MDM Rules/Calculators/A&P                       Patient Vitals for the past 24 hrs:  BP Temp Temp src Pulse Resp SpO2 Height Weight  02/14/20 1607 -- -- -- -- -- -- 5' (1.524 m) 105.2 kg  02/14/20 1557 -- -- -- -- -- -- 5' (1.524 m) 105.2 kg  02/14/20 1556 (!) 153/96 99.2 F (37.3 C) Oral 70 18 94 % -- --    5:59 PM Reevaluation with update and discussion. After initial assessment and treatment, an updated evaluation reveals she remains comfortable has no further complaints.  Findings  discussed and questions answered. Daleen Bo   Medical Decision Making:  This patient is presenting for evaluation of shortness of breath with elevated D-dimer, which does require a range of treatment options, and his a complaint  that involves a high risk of morbidity and mortality. The differential diagnoses include PE, VTE, bronchitis. I decided  to review old records, and in summary patient presenting with signs and symptoms of PE requiring evaluation with CT imaging. I did not require additional historical information from anyone. Clinical Laboratory Tests Ordered, included CBC, b met, D-dimer, BNP.  Reviewed, normal BNP, low potassium, high glucose, high D-dimer, normal CBC Radiologic Tests Ordered, included CT angiogram chest. I independently Visualized: CT images, which show enlarged pulmonary artery but no PE  Critical Interventions-clinical evaluation, laboratory testing, CT angiogram, reassessment and observation.  Treated with potassium.  After These Interventions, the Patient was reevaluated and was found stable without need for hospitalization.  Patient is on Lasix without taking potassium.  Doubt PE, pneumonia or unstable cardiopulmonary condition.  CRITICAL CARE-no Performed by: Daleen Bo  Nursing Notes Reviewed/ Care Coordinated Applicable Imaging Reviewed Interpretation of Laboratory Data incorporated into ED treatment  The patient appears reasonably screened and/or stabilized for discharge and I doubt any other medical condition or other [facility] requiring further screening, evaluation, or treatment in the ED at this time prior to discharge.  Plan: Home Medications-continue current; Home Treatments-eat foods with more potassium; return here if the recommended treatment, does not improve the symptoms; Recommended follow up-PCP checkup 1 week and.    Final Clinical Impression(s) / ED Diagnoses Final diagnoses:  None    Rx / DC Orders ED Discharge Orders         Ordered    potassium chloride SA (KLOR-CON) 20 MEQ tablet  2 times daily     02/14/20 1802           Daleen Bo, MD 02/14/20 [phone]

## 2020-02-14 NOTE — ED Triage Notes (Signed)
Per patient, she has had shobr and swelling, pcp has been checking her heart to make sure nothing is wrong. Patient had echo yesterday, and states her d dimer was elevated. Patient says pcp wanted her to come to hospital to get checked, patient went to [facility] yesterday but was tired of waiting and didn't get results.

## 2020-02-14 NOTE — Discharge Instructions (Addendum)
There is no sign of blood clot, in your lungs.  Your potassium level is low which may be making you feel bad.  We sent a prescription for potassium to your drugstore.  See your doctor next week for checkup.

## 2020-02-19 ENCOUNTER — Ambulatory Visit (INDEPENDENT_AMBULATORY_CARE_PROVIDER_SITE_OTHER): Payer: Medicare Other | Admitting: Cardiology

## 2020-02-19 ENCOUNTER — Other Ambulatory Visit: Payer: Self-pay

## 2020-02-19 ENCOUNTER — Encounter: Payer: Self-pay | Admitting: Cardiology

## 2020-02-19 VITALS — BP 128/82 | HR 64 | Ht 60.0 in | Wt 235.0 lb

## 2020-02-19 DIAGNOSIS — Z6841 Body Mass Index (BMI) 40.0 and over, adult: Secondary | ICD-10-CM

## 2020-02-19 DIAGNOSIS — E119 Type 2 diabetes mellitus without complications: Secondary | ICD-10-CM | POA: Diagnosis not present

## 2020-02-19 DIAGNOSIS — R079 Chest pain, unspecified: Secondary | ICD-10-CM

## 2020-02-19 DIAGNOSIS — Z7189 Other specified counseling: Secondary | ICD-10-CM

## 2020-02-19 DIAGNOSIS — R0600 Dyspnea, unspecified: Secondary | ICD-10-CM

## 2020-02-19 DIAGNOSIS — R0609 Other forms of dyspnea: Secondary | ICD-10-CM

## 2020-02-19 DIAGNOSIS — I10 Essential (primary) hypertension: Secondary | ICD-10-CM

## 2020-02-19 DIAGNOSIS — E7800 Pure hypercholesterolemia, unspecified: Secondary | ICD-10-CM

## 2020-02-19 NOTE — Progress Notes (Signed)
Cardiology Office Note:    Date:  02/19/2020   ID:  [name], DOB 08/12/1946, MRN [id number]  PCP:  Seward Carol, MD  Cardiologist:  Buford Dresser, MD  Referring MD: Seward Carol, MD   CC: new patient evaluation for dyspnea on exertion  History of Present Illness:    [name] is a 74 y.o. female with a hx of hypertension, type II diabetes, sleep apnea, obesity with BMI 45 who is seen as a new consult at the request of Seward Carol, MD for the evaluation and management of dyspnea on exertion.  Reviewed records received from Dr. Lina Sar office. Seen by Dr. Delfina Redwood on 02/13/20. She had been seen the week prior with concern for volume overload. BNP normal, 2D echo report reviewed below. Chest x-ray unremarkable. Continues to have dyspnea on exertion and sensation that she cannot take a deep breath.   Echo report from [place] dated 02/13/20 notes EF 60-65%, mild LVH, noted diastolic dysfunction (cannot see all parameters, E/A 0.54, E/E' 13). Aortic valve sclerosis without stenosis. Mild MR. Mild TR. Normal RVSP. No effusion noted.  Today: Sent to ER since d-dimer was elevated, waited a long time at [facility], went home. When D-dimer was higher on recheck, went back to [facility], CTPE negative. Noted to have PA enlargement of 3.5 cm. hsTn 12, 11. BNP 76.  Has a hiatal hernia, sometimes has upper abdominal pressure, not really a pain. Related to eating. Feels like when she takes a deep breath she can't pull it all the way up.  Started when she was using CPAP, felt like her chest was heavy. Blood pressure was elevated then as well.   Now on losartan 100 mg daily, took this today. Breathing is somewhat better today, and blood pressure well controlled. Can do any activity, but walking is the most limiting for her. Thinks she can't walk even 1/2 mile before needing to stop. Walking from the parking lot to church made her very short of breath.    Denies chest pain, shortness  of breath at rest. No PND, orthopnea, LE edema or unexpected weight gain. No syncope or palpitations.  Past Medical History:  Diagnosis Date  . Anemia   . Anxiety   . Arthritis   . Complication of anesthesia years ago   jittery and nervous after 1 d and c  . Diabetes mellitus    type 2  . Esophageal stricture   . GERD (gastroesophageal reflux disease)   . Heart palpitations    heart palpitaions last 2 weeks, family md  dr polite aware no ekg done  . Hypertension   . Sleep apnea    does not tolerate cpap  . UTI (urinary tract infection)     Past Surgical History:  Procedure Laterality Date  . CESAREAN SECTION     x 1  . CHOLECYSTECTOMY    . COLONOSCOPY WITH PROPOFOL N/A 09/14/2014   Procedure: COLONOSCOPY WITH PROPOFOL;  Surgeon: Garlan Fair, MD;  Location: [facility];  Service: Endoscopy;  Laterality: N/A;  . DILATION AND CURETTAGE OF UTERUS     2 or 3 times  . ESOPHAGOGASTRODUODENOSCOPY     said she had one many years ago but can't remember the year  . surgery for leg fracture Right   . TUBAL LIGATION      Current Medications: Current Outpatient Medications on File Prior to Visit  Medication Sig  . ACCU-CHEK SMARTVIEW test strip   . [place]  31G X 5/16" 0.3 ML MISC   . Blood Glucose Monitoring Suppl (ACCU-CHEK NANO SMARTVIEW) W/DEVICE KIT   . FLUoxetine (PROZAC) 40 MG capsule Take 40 mg by mouth every morning.   . insulin glargine (LANTUS) 100 UNIT/ML injection Inject 40 Units into the skin every morning.   Elmore Guise Devices (SIMPLE DIAGNOSTICS LANCING DEV) MISC   . losartan (COZAAR) 25 MG tablet   . metoprolol (LOPRESSOR) 50 MG tablet Take 50 mg by mouth every morning.   Marland Kitchen omeprazole (PRILOSEC) 20 MG capsule   . potassium chloride SA (KLOR-CON) 20 MEQ tablet Take 1 tablet (20 mEq total) by mouth 2 (two) times daily.  . sitaGLIPtin (JANUVIA) 100 MG tablet Take 100 mg by mouth every morning.   No current facility-administered medications  on file prior to visit.     Allergies:   Patient has no known allergies.   Social History   Tobacco Use  . Smoking status: Never Smoker  . Smokeless tobacco: Never Used  Substance Use Topics  . Alcohol use: Yes    Alcohol/week: 0.0 standard drinks    Comment: occasional wine  . Drug use: No    Family History: family history includes Colon cancer in her father; Diabetes in her father, mother, sister, sister, sister, and sister; Ovarian cancer in her mother. There is no history of Esophageal cancer or Breast cancer.  ROS:   Please see the history of present illness.  Additional pertinent ROS: Constitutional: Negative for chills, fever, night sweats, unintentional weight loss  HENT: Negative for ear pain and hearing loss.   Eyes: Negative for loss of vision and eye pain.  Respiratory: Negative for cough, sputum, wheezing.   Cardiovascular: See HPI. Gastrointestinal: Negative for abdominal pain, melena, and hematochezia.  Genitourinary: Negative for dysuria and hematuria.  Musculoskeletal: Negative for falls and myalgias.  Skin: Negative for itching and rash.  Neurological: Negative for focal weakness, focal sensory changes and loss of consciousness.  Endo/Heme/Allergies: Does not bruise/bleed easily.     EKGs/Labs/Other Studies Reviewed:    The following studies were reviewed today: [facility] echo report from 02/13/20 notes EF 60-65%, mild LVH, noted diastolic dysfunction (cannot see all parameters, E/A 0.54, E/E' 13). Aortic valve sclerosis without stenosis. Mild MR. Mild TR. Normal RVSP. No effusion noted.  EKG:  EKG is personally reviewed.  The ekg ordered today demonstrates normal sinus rhythm at 64 bpm  Recent Labs: 02/14/2020: B Natriuretic Peptide 76.0; BUN 12; Creatinine, Ser 0.58; Hemoglobin 13.0; Platelets 168; Potassium 3.0; Sodium 138  Recent Lipid Panel No results found for: CHOL, TRIG, HDL, CHOLHDL, VLDL, LDLCALC, LDLDIRECT  Physical Exam:    VS:  BP 128/82    Pulse 64   Ht 5' (1.524 m)   Wt 235 lb (106.6 kg)   SpO2 96%   BMI 45.90 kg/m     Wt Readings from Last 3 Encounters:  02/19/20 235 lb (106.6 kg)  02/14/20 232 lb (105.2 kg)  08/22/18 226 lb (102.5 kg)    GEN: Well nourished, well developed in no acute distress HEENT: Normal, moist mucous membranes NECK: No JVD CARDIAC: regular rhythm, normal S1 and S2, no rubs or gallops. No murmurs. VASCULAR: Radial and DP pulses 2+ bilaterally. No carotid bruits RESPIRATORY:  Clear to auscultation without rales, wheezing or rhonchi  ABDOMEN: Soft, non-tender, non-distended MUSCULOSKELETAL:  Ambulates independently SKIN: Warm and dry, no edema NEUROLOGIC:  Alert and oriented x 3. No focal neuro deficits noted. PSYCHIATRIC:  Normal affect    ASSESSMENT:  1. Chest pain, unspecified type   2. Type 2 diabetes mellitus without complication, without long-term current use of insulin ([place])   3. Dyspnea on exertion   4. Essential hypertension   5. Pure hypercholesterolemia   6. Cardiac risk counseling   7. Counseling on health promotion and disease prevention   8. Class 3 severe obesity due to excess calories with serious comorbidity and body mass index (BMI) of 45.0 to 49.9 in adult [facility])    PLAN:    Dyspnea on exertion, rare nonexertional chest pressure: -per echo report (I cannot see images), EF 60-65% -concern that this may be an anginal equivalent -discussed nuclear stress/lexiscan and CT coronary angiography. Discussed pros and cons of each, including but not limited to false positive/false negative risk, radiation risk, and risk of IV contrast dye. Based on shared decision making, decision was made to pursue lexiscan nuclear stress test  Type II diabetes, on insulin: last A1c 8.7 -CV risk factor -on sitagliptin and insulin  Hypertension: -continue losartan 25 mg, metoprolol tartrate 50 mg BID  Hypercholesterolemia: -per [facility], last lipids 06/11/19: Tchol 207, HDL 70, LDL 124, TG  63 -with diabetes, recommend statin. Goal LDL <100 -we discussed statin today. She is hesitant. Wants to see what stress test shows first  Obesity: BMI 45 -multiple CV risk factors. Suggests metabolic syndrome  Cardiac risk counseling and prevention recommendations: -recommend heart healthy/Mediterranean diet, with whole grains, fruits, vegetable, fish, lean meats, nuts, and olive oil. Limit salt. -recommend moderate walking, 3-5 times/week for 30-50 minutes each session. Aim for at least 150 minutes.week. Goal should be pace of 3 miles/hours, or walking 1.5 miles in 30 minutes -recommend avoidance of tobacco products. Avoid excess alcohol. -ASCVD risk score:  Plan for follow up: to be determine based on results of stress test. If stress normal, will see back in 3 mos to talk about risk factor modification.  Buford Dresser, MD, PhD [place]  [facility]    Medication Adjustments/Labs and Tests Ordered: Current medicines are reviewed at length with the patient today.  Concerns regarding medicines are outlined above.  Orders Placed This Encounter  Procedures  . MYOCARDIAL PERFUSION IMAGING  . EKG 12-Lead   No orders of the defined types were placed in this encounter.   Patient Instructions  Medication Instructions:  Your Physician recommend you continue on your current medication as directed.    *If you need a refill on your cardiac medications before your next appointment, please call your pharmacy*   Lab Work: None   Testing/Procedures: Your physician has requested that you have a lexiscan myoview. For further information please visit [email or web address]. Please follow instruction sheet, as given. [place] [address]    Follow-Up: At [facility], you and your health needs are our priority.  As part of our continuing mission to provide you with exceptional heart care, we have created designated Provider Care Teams.  These Care Teams include  your primary Cardiologist (physician) and Advanced Practice Providers (APPs -  Physician Assistants and Nurse Practitioners) who all work together to provide you with the care you need, when you need it.  We recommend signing up for the patient portal called "MyChart".  Sign up information is provided on this After Visit Summary.  MyChart is used to connect with patients for Virtual Visits (Telemedicine).  Patients are able to view lab/test results, encounter notes, upcoming appointments, etc.  Non-urgent messages can be sent to your provider as well.   To learn  more about what you can do with MyChart, go to [email or web address].    Your next appointment:   As needed   The format for your next appointment:   Either In Person or Virtual  Provider:   Buford Dresser, MD  You are scheduled for a Myocardial Perfusion Imaging Study on.  Please arrive 15 minutes prior to your appointment time for registration and insurance purposes.  The test will take approximately 3 to 4 hours to complete; you may bring reading material.  If someone comes with you to your appointment, they will need to remain in the main lobby due to limited space in the testing area. **If you are pregnant or breastfeeding, please notify the nuclear lab prior to your appointment**  How to prepare for your Myocardial Perfusion Test: . Do not eat or drink 3 hours prior to your test, except you may have water. . Do not consume products containing caffeine (regular or decaffeinated) 12 hours prior to your test. (ex: coffee, chocolate, sodas, tea). . Do bring a list of your current medications with you.  If not listed below, you may take your medications as normal. . Do wear comfortable clothes (no dresses or overalls) and walking shoes, tennis shoes preferred (No heels or open toe shoes are allowed). . Do NOT wear cologne, perfume, aftershave, or lotions (deodorant is allowed). . If these instructions are not followed, your  test will have to be rescheduled.  Please report to [address] for your test.  If you have questions or concerns about your appointment, you can call the Nuclear Lab at [phone].  If you cannot keep your appointment, please provide 24 hours notification to the Nuclear Lab, to avoid a possible $50 charge to your account.   Signed, Buford Dresser, MD PhD 02/19/2020    [place]

## 2020-02-19 NOTE — Patient Instructions (Signed)
Medication Instructions:  Your Physician recommend you continue on your current medication as directed.    *If you need a refill on your cardiac medications before your next appointment, please call your pharmacy*   Lab Work: None   Testing/Procedures: Your physician has requested that you have a lexiscan myoview. For further information please visit [email or web address] Please follow instruction sheet, as given. [address]    Follow-Up: At [facility], you and your health needs are our priority.  As part of our continuing mission to provide you with exceptional heart care, we have created designated Provider Care Teams.  These Care Teams include your primary Cardiologist (physician) and Advanced Practice Providers (APPs -  Physician Assistants and Nurse Practitioners) who all work together to provide you with the care you need, when you need it.  We recommend signing up for the patient portal called "MyChart".  Sign up information is provided on this After Visit Summary.  MyChart is used to connect with patients for Virtual Visits (Telemedicine).  Patients are able to view lab/test results, encounter notes, upcoming appointments, etc.  Non-urgent messages can be sent to your provider as well.   To learn more about what you can do with MyChart, go to [email or web address].    Your next appointment:   As needed   The format for your next appointment:   Either In Person or Virtual  Provider:   Jodelle Red, MD  You are scheduled for a Myocardial Perfusion Imaging Study on.  Please arrive 15 minutes prior to your appointment time for registration and insurance purposes.  The test will take approximately 3 to 4 hours to complete; you may bring reading material.  If someone comes with you to your appointment, they will need to remain in the main lobby due to limited space in the testing area. **If you are pregnant or breastfeeding, please notify the  nuclear lab prior to your appointment**  How to prepare for your Myocardial Perfusion Test: . Do not eat or drink 3 hours prior to your test, except you may have water. . Do not consume products containing caffeine (regular or decaffeinated) 12 hours prior to your test. (ex: coffee, chocolate, sodas, tea). . Do bring a list of your current medications with you.  If not listed below, you may take your medications as normal. . Do wear comfortable clothes (no dresses or overalls) and walking shoes, tennis shoes preferred (No heels or open toe shoes are allowed). . Do NOT wear cologne, perfume, aftershave, or lotions (deodorant is allowed). . If these instructions are not followed, your test will have to be rescheduled.  Please report to [address] for your test.  If you have questions or concerns about your appointment, you can call the Nuclear Lab at [phone].  If you cannot keep your appointment, please provide 24 hours notification to the Nuclear Lab, to avoid a possible $50 charge to your account.

## 2020-02-24 ENCOUNTER — Telehealth (HOSPITAL_COMMUNITY): Payer: Self-pay

## 2020-02-24 NOTE — Telephone Encounter (Signed)
Encounter complete. 

## 2020-02-26 ENCOUNTER — Other Ambulatory Visit: Payer: Self-pay

## 2020-02-26 ENCOUNTER — Ambulatory Visit (HOSPITAL_COMMUNITY)
Admission: RE | Admit: 2020-02-26 | Discharge: 2020-02-26 | Disposition: A | Discharge status: Home / Self Care | Payer: Medicare Other | Source: Ambulatory Visit | Attending: Cardiology | Admitting: Cardiology

## 2020-02-26 DIAGNOSIS — R079 Chest pain, unspecified: Secondary | ICD-10-CM | POA: Insufficient documentation

## 2020-02-26 DIAGNOSIS — E119 Type 2 diabetes mellitus without complications: Secondary | ICD-10-CM | POA: Insufficient documentation

## 2020-02-26 MED ORDER — REGADENOSON 0.4 MG/5ML IV SOLN
0.4000 mg | Freq: Once | INTRAVENOUS | Status: AC
  Administered 2020-02-26: 0.4 mg via INTRAVENOUS

## 2020-02-26 MED ORDER — AMINOPHYLLINE 25 MG/ML IV SOLN
75.0000 mg | Freq: Once | INTRAVENOUS | Status: AC
  Administered 2020-02-26: 75 mg via INTRAVENOUS

## 2020-02-26 MED ORDER — TECHNETIUM TC 99M TETROFOSMIN IV KIT
27.6000 | PACK | Freq: Once | INTRAVENOUS | Status: AC | PRN
  Administered 2020-02-26: 27.6 via INTRAVENOUS
  Filled 2020-02-26: qty 28

## 2020-02-27 ENCOUNTER — Ambulatory Visit (HOSPITAL_COMMUNITY)
Admission: RE | Admit: 2020-02-27 | Discharge: 2020-02-27 | Disposition: A | Discharge status: Home / Self Care | Payer: Medicare Other | Source: Ambulatory Visit | Attending: Cardiology | Admitting: Cardiology

## 2020-02-27 LAB — MYOCARDIAL PERFUSION IMAGING
LV dias vol: 71 mL (ref 46–106)
LV sys vol: 23 mL
Peak HR: 97 {beats}/min
Rest HR: 70 {beats}/min
SDS: 1
SRS: 4
SSS: 5
TID: 0.82

## 2020-02-27 MED ORDER — TECHNETIUM TC 99M TETROFOSMIN IV KIT
29.7000 | PACK | Freq: Once | INTRAVENOUS | Status: AC | PRN
  Administered 2020-02-27: 29.7 via INTRAVENOUS

## 2020-04-01 ENCOUNTER — Encounter: Payer: Self-pay | Admitting: Cardiology

## 2020-04-02 ENCOUNTER — Telehealth: Payer: Self-pay

## 2020-04-02 NOTE — Telephone Encounter (Signed)
MD requesting to have pt schedule a follow up appointment in Aug/July. Message sent to scheduling.

## 2020-05-11 ENCOUNTER — Ambulatory Visit: Payer: Medicare Other | Admitting: Cardiology

## 2020-11-12 DIAGNOSIS — E119 Type 2 diabetes mellitus without complications: Secondary | ICD-10-CM | POA: Diagnosis not present

## 2020-11-12 DIAGNOSIS — E7800 Pure hypercholesterolemia, unspecified: Secondary | ICD-10-CM | POA: Diagnosis not present

## 2020-11-12 DIAGNOSIS — E1169 Type 2 diabetes mellitus with other specified complication: Secondary | ICD-10-CM | POA: Diagnosis not present

## 2020-11-12 DIAGNOSIS — I10 Essential (primary) hypertension: Secondary | ICD-10-CM | POA: Diagnosis not present

## 2020-11-19 DIAGNOSIS — I10 Essential (primary) hypertension: Secondary | ICD-10-CM | POA: Diagnosis not present

## 2020-11-19 DIAGNOSIS — Z0000 Encounter for general adult medical examination without abnormal findings: Secondary | ICD-10-CM | POA: Diagnosis not present

## 2020-11-19 DIAGNOSIS — F418 Other specified anxiety disorders: Secondary | ICD-10-CM | POA: Diagnosis not present

## 2020-11-19 DIAGNOSIS — E559 Vitamin D deficiency, unspecified: Secondary | ICD-10-CM | POA: Diagnosis not present

## 2020-11-19 DIAGNOSIS — E1169 Type 2 diabetes mellitus with other specified complication: Secondary | ICD-10-CM | POA: Diagnosis not present

## 2020-11-19 DIAGNOSIS — I700 Atherosclerosis of aorta: Secondary | ICD-10-CM | POA: Diagnosis not present

## 2020-11-19 DIAGNOSIS — Z794 Long term (current) use of insulin: Secondary | ICD-10-CM | POA: Diagnosis not present

## 2020-11-19 DIAGNOSIS — G4733 Obstructive sleep apnea (adult) (pediatric): Secondary | ICD-10-CM | POA: Diagnosis not present

## 2020-11-19 DIAGNOSIS — E7800 Pure hypercholesterolemia, unspecified: Secondary | ICD-10-CM | POA: Diagnosis not present

## 2021-01-17 DIAGNOSIS — E119 Type 2 diabetes mellitus without complications: Secondary | ICD-10-CM | POA: Diagnosis not present

## 2021-01-17 DIAGNOSIS — I10 Essential (primary) hypertension: Secondary | ICD-10-CM | POA: Diagnosis not present

## 2021-01-17 DIAGNOSIS — E7800 Pure hypercholesterolemia, unspecified: Secondary | ICD-10-CM | POA: Diagnosis not present

## 2021-01-17 DIAGNOSIS — E1169 Type 2 diabetes mellitus with other specified complication: Secondary | ICD-10-CM | POA: Diagnosis not present

## 2021-01-25 DIAGNOSIS — I10 Essential (primary) hypertension: Secondary | ICD-10-CM | POA: Diagnosis not present

## 2021-01-25 DIAGNOSIS — R300 Dysuria: Secondary | ICD-10-CM | POA: Diagnosis not present

## 2021-02-28 DIAGNOSIS — K449 Diaphragmatic hernia without obstruction or gangrene: Secondary | ICD-10-CM | POA: Diagnosis not present

## 2021-02-28 DIAGNOSIS — R198 Other specified symptoms and signs involving the digestive system and abdomen: Secondary | ICD-10-CM | POA: Diagnosis not present

## 2021-02-28 DIAGNOSIS — K219 Gastro-esophageal reflux disease without esophagitis: Secondary | ICD-10-CM | POA: Diagnosis not present

## 2021-02-28 DIAGNOSIS — Z800 Family history of malignant neoplasm of digestive organs: Secondary | ICD-10-CM | POA: Diagnosis not present

## 2021-04-12 DIAGNOSIS — I10 Essential (primary) hypertension: Secondary | ICD-10-CM | POA: Diagnosis not present

## 2021-04-12 DIAGNOSIS — E7800 Pure hypercholesterolemia, unspecified: Secondary | ICD-10-CM | POA: Diagnosis not present

## 2021-04-12 DIAGNOSIS — K219 Gastro-esophageal reflux disease without esophagitis: Secondary | ICD-10-CM | POA: Diagnosis not present

## 2021-04-12 DIAGNOSIS — E1169 Type 2 diabetes mellitus with other specified complication: Secondary | ICD-10-CM | POA: Diagnosis not present

## 2021-04-12 DIAGNOSIS — E119 Type 2 diabetes mellitus without complications: Secondary | ICD-10-CM | POA: Diagnosis not present

## 2021-05-02 ENCOUNTER — Other Ambulatory Visit: Payer: Self-pay | Admitting: Internal Medicine

## 2021-05-02 ENCOUNTER — Ambulatory Visit
Admission: RE | Admit: 2021-05-02 | Discharge: 2021-05-02 | Disposition: A | Discharge status: Home / Self Care | Payer: Medicare Other | Source: Ambulatory Visit | Attending: Internal Medicine | Admitting: Internal Medicine

## 2021-05-02 ENCOUNTER — Other Ambulatory Visit: Payer: Self-pay

## 2021-05-02 DIAGNOSIS — G4733 Obstructive sleep apnea (adult) (pediatric): Secondary | ICD-10-CM | POA: Diagnosis not present

## 2021-05-02 DIAGNOSIS — F418 Other specified anxiety disorders: Secondary | ICD-10-CM | POA: Diagnosis not present

## 2021-05-02 DIAGNOSIS — I700 Atherosclerosis of aorta: Secondary | ICD-10-CM | POA: Diagnosis not present

## 2021-05-02 DIAGNOSIS — E1169 Type 2 diabetes mellitus with other specified complication: Secondary | ICD-10-CM | POA: Diagnosis not present

## 2021-05-02 DIAGNOSIS — M5442 Lumbago with sciatica, left side: Secondary | ICD-10-CM

## 2021-05-02 DIAGNOSIS — M545 Low back pain, unspecified: Secondary | ICD-10-CM | POA: Diagnosis not present

## 2021-05-02 DIAGNOSIS — I10 Essential (primary) hypertension: Secondary | ICD-10-CM | POA: Diagnosis not present

## 2021-05-02 DIAGNOSIS — E7800 Pure hypercholesterolemia, unspecified: Secondary | ICD-10-CM | POA: Diagnosis not present

## 2021-05-11 ENCOUNTER — Other Ambulatory Visit: Payer: Self-pay

## 2021-05-11 ENCOUNTER — Ambulatory Visit
Admission: EM | Admit: 2021-05-11 | Discharge: 2021-05-11 | Disposition: A | Discharge status: Home / Self Care | Attending: Family Medicine | Admitting: Family Medicine

## 2021-05-11 DIAGNOSIS — J069 Acute upper respiratory infection, unspecified: Secondary | ICD-10-CM | POA: Diagnosis not present

## 2021-05-11 MED ORDER — LIDOCAINE VISCOUS HCL 2 % MT SOLN: 10.0000 mL | OROMUCOSAL | 0 refills | Status: AC | PRN

## 2021-05-11 MED ORDER — BENZONATATE 200 MG PO CAPS: 200.0000 mg | ORAL_CAPSULE | Freq: Three times a day (TID) | ORAL | 0 refills | Status: DC | PRN

## 2021-05-11 NOTE — ED Provider Notes (Signed)
[name]    CSN: [id number] Arrival date & time: 05/11/21  1157      History   Chief Complaint Chief Complaint  Patient presents with   Cough   Sore Throat    HPI [name] is a 75 y.o. female.   Patient presenting today with 4-day history of cough, sore throat, body aches, generalized malaise.  Denies fever, chills, sweats, chest pain, shortness of breath, abdominal pain, nausea vomiting or diarrhea.  Taking over-the-counter pain relievers and cold and cough medications with mild temporary relief of symptoms.  No known sick contacts.  No known history of pulmonary disease.  2 negative home COVID test thus far.   Past Medical History:  Diagnosis Date   Anemia    Anxiety    Arthritis    Complication of anesthesia years ago   jittery and nervous after 1 d and c   Diabetes mellitus    type 2   Esophageal stricture    GERD (gastroesophageal reflux disease)    Heart palpitations    heart palpitaions last 2 weeks, family md  dr polite aware no ekg done   Hypertension    Sleep apnea    does not tolerate cpap   UTI (urinary tract infection)     Patient Active Problem List   Diagnosis Date Noted   Left leg pain 08/03/2015   Left knee pain 05/19/2015    Past Surgical History:  Procedure Laterality Date   CESAREAN SECTION     x 1   CHOLECYSTECTOMY     COLONOSCOPY WITH PROPOFOL N/A 09/14/2014   Procedure: COLONOSCOPY WITH PROPOFOL;  Surgeon: Garlan Fair, MD;  Location: [facility];  Service: Endoscopy;  Laterality: N/A;   DILATION AND CURETTAGE OF UTERUS     2 or 3 times   ESOPHAGOGASTRODUODENOSCOPY     said she had one many years ago but can't remember the year   surgery for leg fracture Right    TUBAL LIGATION      OB History   No obstetric history on file.      Home Medications    Prior to Admission medications   Medication Sig Start Date End Date Taking? Authorizing Provider  benzonatate (TESSALON) 200 MG capsule Take 1  capsule (200 mg total) by mouth 3 (three) times daily as needed for cough. 05/11/21  Yes Volney American, PA-C  lidocaine (XYLOCAINE) 2 % solution Use as directed 10 mLs in the mouth or throat as needed for mouth pain. 05/11/21  Yes Volney American, PA-C  ACCU-CHEK SMARTVIEW test strip  04/19/15   [provider]  AIMSCO INSULIN SYR ULTRA THIN 31G X 5/16" 0.3 ML MISC  04/19/15   [provider]  Blood Glucose Monitoring Suppl (ACCU-CHEK NANO SMARTVIEW) W/DEVICE KIT  04/19/15   [provider]  FLUoxetine (PROZAC) 40 MG capsule Take 40 mg by mouth every morning.     [provider]  insulin glargine (LANTUS) 100 UNIT/ML injection Inject 40 Units into the skin every morning.     [provider]  Lancet Devices (SIMPLE DIAGNOSTICS LANCING DEV) MISC  04/19/15   [provider]  losartan (COZAAR) 25 MG tablet  06/26/18   [provider]  metoprolol (LOPRESSOR) 50 MG tablet Take 50 mg by mouth every morning.     [provider]  omeprazole (PRILOSEC) 20 MG capsule  05/14/15   [provider]  potassium chloride SA (KLOR-CON) 20 MEQ tablet Take  1 tablet (20 mEq total) by mouth 2 (two) times daily. 02/14/20   Daleen Bo, MD  sitaGLIPtin (JANUVIA) 100 MG tablet Take 100 mg by mouth every morning.    [provider]    Family History Family History  Problem Relation Age of Onset   Diabetes Mother    Ovarian cancer Mother    Colon cancer Father    Diabetes Father    Diabetes Sister    Diabetes Sister    Diabetes Sister    Diabetes Sister    Esophageal cancer Neg Hx    Breast cancer Neg Hx     Social History Social History   Tobacco Use   Smoking status: Never   Smokeless tobacco: Never  Vaping Use   Vaping Use: Never used  Substance Use Topics   Alcohol use: Yes    Alcohol/week: 0.0 standard drinks    Comment: occasional wine   Drug use: No     Allergies   Patient has no known  allergies.   Review of Systems Review of Systems Per HPI  Physical Exam Triage Vital Signs ED Triage Vitals  Enc Vitals Group     BP 05/11/21 1233 136/90     Pulse Rate 05/11/21 1233 77     Resp 05/11/21 1233 20     Temp 05/11/21 1233 98.2 F (36.8 C)     Temp src --      SpO2 05/11/21 1233 94 %     Weight --      Height --      Head Circumference --      Peak Flow --      Pain Score 05/11/21 1232 5     Pain Loc --      Pain Edu? --      Excl. in [place]? --    No data found.  Updated Vital Signs BP 136/90   Pulse 77   Temp 98.2 F (36.8 C)   Resp 20   SpO2 94%   Visual Acuity Right Eye Distance:   Left Eye Distance:   Bilateral Distance:    Right Eye Near:   Left Eye Near:    Bilateral Near:     Physical Exam Vitals and nursing note reviewed.  Constitutional:      Appearance: Normal appearance. She is not ill-appearing.  HENT:     Head: Atraumatic.     Right Ear: Tympanic membrane normal.     Left Ear: Tympanic membrane normal.     Nose: Rhinorrhea present.     Mouth/Throat:     Mouth: Mucous membranes are moist.     Pharynx: Oropharynx is clear. Posterior oropharyngeal erythema present. No oropharyngeal exudate.  Eyes:     Extraocular Movements: Extraocular movements intact.     Conjunctiva/sclera: Conjunctivae normal.  Cardiovascular:     Rate and Rhythm: Normal rate and regular rhythm.     Heart sounds: Normal heart sounds.  Pulmonary:     Effort: Pulmonary effort is normal.     Breath sounds: Normal breath sounds. No wheezing or rales.  Abdominal:     General: Bowel sounds are normal. There is no distension.     Palpations: Abdomen is soft.     Tenderness: There is no abdominal tenderness. There is no right CVA tenderness, left CVA tenderness or guarding.  Musculoskeletal:        General: Normal range of motion.     Cervical back: Normal range of motion and neck supple.  Skin:    General: Skin is warm and dry.  Neurological:     Mental  Status: She is alert and oriented to person, place, and time.     Motor: No weakness.     Gait: Gait normal.  Psychiatric:        Mood and Affect: Mood normal.        Thought Content: Thought content normal.        Judgment: Judgment normal.   UC Treatments / Results  Labs (all labs ordered are listed, but only abnormal results are displayed) Labs Reviewed  NOVEL CORONAVIRUS, NAA    EKG   Radiology No results found.  Procedures Procedures (including critical care time)  Medications Ordered in UC Medications - No data to display  Initial Impression / Assessment and Plan / UC Course  I have reviewed the triage vital signs and the nursing notes.  Pertinent labs & imaging results that were available during my care of the patient were reviewed by me and considered in my medical decision making (see chart for details).     Vitals and exam reassuring, consistent with viral URI.  COVID PCR pending though 2 negative home test thus far.  Discussed symptomatic management with Tessalon Perles, viscous lidocaine, supportive over-the-counter medications and home care.  Strict return precautions given for acutely worsening symptoms.  Final Clinical Impressions(s) / UC Diagnoses   Final diagnoses:  Viral URI with cough   Discharge Instructions   None    ED Prescriptions     Medication Sig Dispense Auth. Provider   benzonatate (TESSALON) 200 MG capsule Take 1 capsule (200 mg total) by mouth 3 (three) times daily as needed for cough. 20 capsule Volney American, PA-C   lidocaine (XYLOCAINE) 2 % solution Use as directed 10 mLs in the mouth or throat as needed for mouth pain. 100 mL Volney American, [place]      PDMP not reviewed this encounter.   Volney American, [place] 05/11/21 1333

## 2021-05-11 NOTE — ED Triage Notes (Signed)
Pt presents with c/o cough sore throat since Sunday , 2 negative covid test

## 2021-05-12 LAB — NOVEL CORONAVIRUS, NAA: SARS-CoV-2, NAA: DETECTED — AB

## 2021-05-12 LAB — SARS-COV-2, NAA 2 DAY TAT

## 2021-06-09 DIAGNOSIS — Z800 Family history of malignant neoplasm of digestive organs: Secondary | ICD-10-CM | POA: Diagnosis not present

## 2021-06-09 DIAGNOSIS — R198 Other specified symptoms and signs involving the digestive system and abdomen: Secondary | ICD-10-CM | POA: Diagnosis not present

## 2021-06-09 DIAGNOSIS — K648 Other hemorrhoids: Secondary | ICD-10-CM | POA: Diagnosis not present

## 2021-06-09 DIAGNOSIS — K5730 Diverticulosis of large intestine without perforation or abscess without bleeding: Secondary | ICD-10-CM | POA: Diagnosis not present

## 2021-06-14 DIAGNOSIS — Z800 Family history of malignant neoplasm of digestive organs: Secondary | ICD-10-CM | POA: Diagnosis not present

## 2021-06-14 DIAGNOSIS — R198 Other specified symptoms and signs involving the digestive system and abdomen: Secondary | ICD-10-CM | POA: Diagnosis not present

## 2021-06-30 DIAGNOSIS — E1169 Type 2 diabetes mellitus with other specified complication: Secondary | ICD-10-CM | POA: Diagnosis not present

## 2021-06-30 DIAGNOSIS — I10 Essential (primary) hypertension: Secondary | ICD-10-CM | POA: Diagnosis not present

## 2021-06-30 DIAGNOSIS — E119 Type 2 diabetes mellitus without complications: Secondary | ICD-10-CM | POA: Diagnosis not present

## 2021-06-30 DIAGNOSIS — K219 Gastro-esophageal reflux disease without esophagitis: Secondary | ICD-10-CM | POA: Diagnosis not present

## 2021-06-30 DIAGNOSIS — E7800 Pure hypercholesterolemia, unspecified: Secondary | ICD-10-CM | POA: Diagnosis not present

## 2021-07-08 DIAGNOSIS — R519 Headache, unspecified: Secondary | ICD-10-CM | POA: Diagnosis not present

## 2021-07-08 DIAGNOSIS — I5189 Other ill-defined heart diseases: Secondary | ICD-10-CM | POA: Diagnosis not present

## 2021-07-08 DIAGNOSIS — M542 Cervicalgia: Secondary | ICD-10-CM | POA: Diagnosis not present

## 2021-07-14 DIAGNOSIS — Z800 Family history of malignant neoplasm of digestive organs: Secondary | ICD-10-CM | POA: Diagnosis not present

## 2021-07-14 DIAGNOSIS — K219 Gastro-esophageal reflux disease without esophagitis: Secondary | ICD-10-CM | POA: Diagnosis not present

## 2021-07-14 DIAGNOSIS — R198 Other specified symptoms and signs involving the digestive system and abdomen: Secondary | ICD-10-CM | POA: Diagnosis not present

## 2021-07-14 DIAGNOSIS — R140 Abdominal distension (gaseous): Secondary | ICD-10-CM | POA: Diagnosis not present

## 2021-09-17 DIAGNOSIS — E1169 Type 2 diabetes mellitus with other specified complication: Secondary | ICD-10-CM | POA: Diagnosis not present

## 2021-09-17 DIAGNOSIS — K219 Gastro-esophageal reflux disease without esophagitis: Secondary | ICD-10-CM | POA: Diagnosis not present

## 2021-09-17 DIAGNOSIS — E119 Type 2 diabetes mellitus without complications: Secondary | ICD-10-CM | POA: Diagnosis not present

## 2021-09-17 DIAGNOSIS — I10 Essential (primary) hypertension: Secondary | ICD-10-CM | POA: Diagnosis not present

## 2021-09-17 DIAGNOSIS — E7800 Pure hypercholesterolemia, unspecified: Secondary | ICD-10-CM | POA: Diagnosis not present

## 2021-10-12 ENCOUNTER — Ambulatory Visit
Admission: EM | Admit: 2021-10-12 | Discharge: 2021-10-12 | Disposition: A | Discharge status: Home / Self Care | Attending: Internal Medicine | Admitting: Internal Medicine

## 2021-10-12 ENCOUNTER — Other Ambulatory Visit: Payer: Self-pay

## 2021-10-12 DIAGNOSIS — R6889 Other general symptoms and signs: Secondary | ICD-10-CM

## 2021-10-12 DIAGNOSIS — J069 Acute upper respiratory infection, unspecified: Secondary | ICD-10-CM | POA: Diagnosis not present

## 2021-10-12 MED ORDER — OSELTAMIVIR PHOSPHATE 75 MG PO CAPS
75.0000 mg | ORAL_CAPSULE | Freq: Two times a day (BID) | ORAL | 0 refills | Status: DC
Start: 2021-10-12 — End: 2022-10-15

## 2021-10-12 MED ORDER — BENZONATATE 100 MG PO CAPS
100.0000 mg | ORAL_CAPSULE | Freq: Three times a day (TID) | ORAL | 0 refills | Status: DC | PRN
Start: 2021-10-12 — End: 2022-10-15

## 2021-10-12 NOTE — Discharge Instructions (Signed)
It appears that you have a viral upper respiratory infection.  Highly suspicious of the flu given your exposure.  You have been prescribed Tamiflu to help treat this.

## 2021-10-12 NOTE — ED Provider Notes (Signed)
[name]    CSN: [id number] Arrival date & time: 10/12/21  1532      History   Chief Complaint Chief Complaint  Patient presents with   Cough    HPI [name] is a 75 y.o. female.   Patient presents with 3-day history of nonproductive cough, nasal congestion, body aches, headache, fatigue.  Patient reports that her son recently tested positive for the flu.  Patient denies any known fevers.  Patient has taken Robitussin and Tylenol for symptoms with improvement.  Denies chest pain, shortness of breath, sore throat, ear pain, nausea, vomiting, diarrhea, abdominal pain.   Cough  Past Medical History:  Diagnosis Date   Anemia    Anxiety    Arthritis    Complication of anesthesia years ago   jittery and nervous after 1 d and c   Diabetes mellitus    type 2   Esophageal stricture    GERD (gastroesophageal reflux disease)    Heart palpitations    heart palpitaions last 2 weeks, family md  dr polite aware no ekg done   Hypertension    Sleep apnea    does not tolerate cpap   UTI (urinary tract infection)     Patient Active Problem List   Diagnosis Date Noted   Left leg pain 08/03/2015   Left knee pain 05/19/2015    Past Surgical History:  Procedure Laterality Date   CESAREAN SECTION     x 1   CHOLECYSTECTOMY     COLONOSCOPY WITH PROPOFOL N/A 09/14/2014   Procedure: COLONOSCOPY WITH PROPOFOL;  Surgeon: Garlan Fair, MD;  Location: [facility];  Service: Endoscopy;  Laterality: N/A;   DILATION AND CURETTAGE OF UTERUS     2 or 3 times   ESOPHAGOGASTRODUODENOSCOPY     said she had one many years ago but can't remember the year   surgery for leg fracture Right    TUBAL LIGATION      OB History   No obstetric history on file.      Home Medications    Prior to Admission medications   Medication Sig Start Date End Date Taking? Authorizing Provider  benzonatate (TESSALON) 100 MG capsule Take 1 capsule (100 mg total) by mouth every 8  (eight) hours as needed for cough. 10/12/21  Yes Bren Steers, Michele Rockers, FNP  oseltamivir (TAMIFLU) 75 MG capsule Take 1 capsule (75 mg total) by mouth every 12 (twelve) hours. 10/12/21  Yes Teodora Medici, FNP  ACCU-CHEK SMARTVIEW test strip  04/19/15   [provider]  AIMSCO INSULIN SYR ULTRA THIN 31G X 5/16" 0.3 ML MISC  04/19/15   [provider]  Blood Glucose Monitoring Suppl (ACCU-CHEK NANO SMARTVIEW) W/DEVICE KIT  04/19/15   [provider]  FLUoxetine (PROZAC) 40 MG capsule Take 40 mg by mouth every morning.     [provider]  insulin glargine (LANTUS) 100 UNIT/ML injection Inject 40 Units into the skin every morning.     [provider]  Lancet Devices (SIMPLE DIAGNOSTICS LANCING DEV) MISC  04/19/15   [provider]  lidocaine (XYLOCAINE) 2 % solution Use as directed 10 mLs in the mouth or throat as needed for mouth pain. 05/11/21   Volney American, PA-C  losartan (COZAAR) 25 MG tablet  06/26/18   [provider]  metoprolol (LOPRESSOR) 50 MG tablet Take 50 mg by mouth every morning.     [provider]  omeprazole (PRILOSEC) 20 MG capsule  05/14/15   [provider]  potassium chloride SA (KLOR-CON) 20 MEQ tablet Take 1 tablet (20 mEq total) by mouth 2 (two) times daily. 02/14/20   Daleen Bo, MD  sitaGLIPtin (JANUVIA) 100 MG tablet Take 100 mg by mouth every morning.    [provider]    Family History Family History  Problem Relation Age of Onset   Diabetes Mother    Ovarian cancer Mother    Colon cancer Father    Diabetes Father    Diabetes Sister    Diabetes Sister    Diabetes Sister    Diabetes Sister    Esophageal cancer Neg Hx    Breast cancer Neg Hx     Social History Social History   Tobacco Use   Smoking status: Never   Smokeless tobacco: Never  Vaping Use   Vaping Use: Never used  Substance Use Topics   Alcohol use: Yes    Alcohol/week: 0.0 standard drinks     Comment: occasional wine   Drug use: No     Allergies   Patient has no known allergies.   Review of Systems Review of Systems Per HPI  Physical Exam Triage Vital Signs ED Triage Vitals [10/12/21 1553]  Enc Vitals Group     BP (!) 144/93     Pulse Rate 77     Resp 18     Temp 98.8 F (37.1 C)     Temp Source Oral     SpO2 95 %     Weight      Height      Head Circumference      Peak Flow      Pain Score 0     Pain Loc      Pain Edu?      Excl. in [place]?    No data found.  Updated Vital Signs BP (!) 144/93 (BP Location: Left Arm)    Pulse 77    Temp 98.8 F (37.1 C) (Oral)    Resp 18    SpO2 95%   Visual Acuity Right Eye Distance:   Left Eye Distance:   Bilateral Distance:    Right Eye Near:   Left Eye Near:    Bilateral Near:     Physical Exam Constitutional:      General: She is not in acute distress.    Appearance: Normal appearance. She is not toxic-appearing or diaphoretic.  HENT:     Head: Normocephalic and atraumatic.     Right Ear: Tympanic membrane and ear canal normal.     Left Ear: Tympanic membrane and ear canal normal.     Nose: Congestion present.     Mouth/Throat:     Mouth: Mucous membranes are moist.     Pharynx: No posterior oropharyngeal erythema.  Eyes:     Extraocular Movements: Extraocular movements intact.     Conjunctiva/sclera: Conjunctivae normal.     Pupils: Pupils are equal, round, and reactive to light.  Cardiovascular:     Rate and Rhythm: Normal rate and regular rhythm.     Pulses: Normal pulses.     Heart sounds: Normal heart sounds.  Pulmonary:     Effort: Pulmonary effort is normal. No respiratory distress.     Breath sounds: Normal breath sounds. No stridor. No wheezing, rhonchi or rales.  Abdominal:     General: Abdomen is flat. Bowel sounds are normal.     Palpations: Abdomen is soft.  Musculoskeletal:  General: Normal range of motion.     Cervical back: Normal range of motion.  Skin:    General: Skin  is warm and dry.  Neurological:     General: No focal deficit present.     Mental Status: She is alert and oriented to person, place, and time. Mental status is at baseline.  Psychiatric:        Mood and Affect: Mood normal.        Behavior: Behavior normal.     UC Treatments / Results  Labs (all labs ordered are listed, but only abnormal results are displayed) Labs Reviewed  COVID-19, FLU A+B NAA    EKG   Radiology No results found.  Procedures Procedures (including critical care time)  Medications Ordered in UC Medications - No data to display  Initial Impression / Assessment and Plan / UC Course  I have reviewed the triage vital signs and the nursing notes.  Pertinent labs & imaging results that were available during my care of the patient were reviewed by me and considered in my medical decision making (see chart for details).     Patient presents with symptoms likely from a viral upper respiratory infection. Differential includes bacterial pneumonia, sinusitis, allergic rhinitis, COVID-19, flu. Do not suspect underlying cardiopulmonary process. Symptoms seem unlikely related to ACS, CHF or COPD exacerbations, pneumonia, pneumothorax. Patient is nontoxic appearing and not in need of emergent medical intervention.  COVID 19 and flu test pending.  Recommended symptom control with over the counter medications.  Highly suspicious of the flu so will opt to treat with Tamiflu despite duration of symptoms given patient's age and comorbidities.  Do not think that chest imaging is necessary given no adventitious lung sounds on exam or signs of respiratory compromise.  Return if symptoms fail to improve in 1-2 weeks or you develop shortness of breath, chest pain, severe headache. Patient states understanding and is agreeable.  Discharged with PCP followup.  Final Clinical Impressions(s) / UC Diagnoses   Final diagnoses:  Flu-like symptoms  Viral upper respiratory tract  infection with cough     Discharge Instructions      It appears that you have a viral upper respiratory infection.  Highly suspicious of the flu given your exposure.  You have been prescribed Tamiflu to help treat this.    ED Prescriptions     Medication Sig Dispense Auth. Provider   oseltamivir (TAMIFLU) 75 MG capsule Take 1 capsule (75 mg total) by mouth every 12 (twelve) hours. 10 capsule [place], [place] E, [place]   benzonatate (TESSALON) 100 MG capsule Take 1 capsule (100 mg total) by mouth every 8 (eight) hours as needed for cough. 21 capsule [place], Michele Rockers, [place]      PDMP not reviewed this encounter.   Teodora Medici, [place] 10/12/21 [phone]

## 2021-10-12 NOTE — ED Triage Notes (Signed)
Pt *hard of hearing*  Pt c/o cough, body aches, chest soreness with cough, headache, malaise, joint pain, sore throat,   Denies earache, nausea, vomiting, diarrhea, constipation.   Onset Monday

## 2021-10-13 LAB — COVID-19, FLU A+B NAA
Influenza A, NAA: DETECTED — AB
Influenza B, NAA: NOT DETECTED
SARS-CoV-2, NAA: NOT DETECTED

## 2021-12-02 ENCOUNTER — Other Ambulatory Visit: Payer: Self-pay

## 2021-12-02 ENCOUNTER — Ambulatory Visit
Admission: EM | Admit: 2021-12-02 | Discharge: 2021-12-02 | Disposition: A | Discharge status: Home / Self Care | Attending: Internal Medicine | Admitting: Internal Medicine

## 2021-12-02 DIAGNOSIS — T23039A Burn of unspecified degree of unspecified multiple fingers (nail), not including thumb, initial encounter: Secondary | ICD-10-CM | POA: Diagnosis not present

## 2021-12-02 DIAGNOSIS — T24111A Burn of first degree of right thigh, initial encounter: Secondary | ICD-10-CM | POA: Diagnosis not present

## 2021-12-02 MED ORDER — SILVER SULFADIAZINE 1 % EX CREA: 1.0000 "application " | TOPICAL_CREAM | Freq: Every day | CUTANEOUS | 0 refills | Status: AC

## 2021-12-02 NOTE — ED Triage Notes (Signed)
Pt c/o burn to right upper thigh that occurred today at [employer] making coffee she states the lid opened and coffee spilt on her. Also burn to right 4th digit on hand.

## 2021-12-02 NOTE — Discharge Instructions (Signed)
You have been prescribed antibiotic cream to apply to burn.  Please do not pop blister.  If blister pops, please keep covered until healed.  Monitor for signs of infection include increased redness, swelling, drainage.  Follow-up with this occurs.

## 2021-12-02 NOTE — ED Provider Notes (Addendum)
[name]    CSN: [id number] Arrival date & time: 12/02/21  1210      History   Chief Complaint Chief Complaint  Patient presents with   Burn    HPI [name] is a 76 y.o. female.   Patient presents with burn to right upper thigh and right third and fourth digits of hand.  Patient reports that she accidentally spilled hot coffee on herself this morning at [employer]   Burn  Past Medical History:  Diagnosis Date   Anemia    Anxiety    Arthritis    Complication of anesthesia years ago   jittery and nervous after 1 d and c   Diabetes mellitus    type 2   Esophageal stricture    GERD (gastroesophageal reflux disease)    Heart palpitations    heart palpitaions last 2 weeks, family md  dr polite aware no ekg done   Hypertension    Sleep apnea    does not tolerate cpap   UTI (urinary tract infection)     Patient Active Problem List   Diagnosis Date Noted   Left leg pain 08/03/2015   Left knee pain 05/19/2015    Past Surgical History:  Procedure Laterality Date   CESAREAN SECTION     x 1   CHOLECYSTECTOMY     COLONOSCOPY WITH PROPOFOL N/A 09/14/2014   Procedure: COLONOSCOPY WITH PROPOFOL;  Surgeon: Garlan Fair, MD;  Location: [facility];  Service: Endoscopy;  Laterality: N/A;   DILATION AND CURETTAGE OF UTERUS     2 or 3 times   ESOPHAGOGASTRODUODENOSCOPY     said she had one many years ago but can't remember the year   surgery for leg fracture Right    TUBAL LIGATION      OB History   No obstetric history on file.      Home Medications    Prior to Admission medications   Medication Sig Start Date End Date Taking? Authorizing Provider  silver sulfADIAZINE (SILVADENE) 1 % cream Apply 1 application topically daily. 12/02/21  Yes Teodora Medici, FNP  ACCU-CHEK SMARTVIEW test strip  04/19/15   [provider]  AIMSCO INSULIN SYR ULTRA THIN 31G X 5/16" 0.3 ML MISC  04/19/15   [provider]   benzonatate (TESSALON) 100 MG capsule Take 1 capsule (100 mg total) by mouth every 8 (eight) hours as needed for cough. 10/12/21   Teodora Medici, FNP  Blood Glucose Monitoring Suppl (ACCU-CHEK NANO SMARTVIEW) W/DEVICE KIT  04/19/15   [provider]  FLUoxetine (PROZAC) 40 MG capsule Take 40 mg by mouth every morning.     [provider]  insulin glargine (LANTUS) 100 UNIT/ML injection Inject 40 Units into the skin every morning.     [provider]  Lancet Devices (SIMPLE DIAGNOSTICS LANCING DEV) MISC  04/19/15   [provider]  lidocaine (XYLOCAINE) 2 % solution Use as directed 10 mLs in the mouth or throat as needed for mouth pain. 05/11/21   Volney American, PA-C  losartan (COZAAR) 25 MG tablet  06/26/18   [provider]  metoprolol (LOPRESSOR) 50 MG tablet Take 50 mg by mouth every morning.     [provider]  omeprazole (PRILOSEC) 20 MG capsule  05/14/15   [provider]  oseltamivir (TAMIFLU) 75 MG capsule Take 1 capsule (75 mg total) by mouth every 12 (twelve) hours. 10/12/21   Teodora Medici, FNP  potassium chloride SA (KLOR-CON) 20 MEQ tablet Take 1 tablet (20 mEq total) by mouth 2 (two) times daily. 02/14/20   Daleen Bo, MD  sitaGLIPtin (JANUVIA) 100 MG tablet Take 100 mg by mouth every morning.    [provider]    Family History Family History  Problem Relation Age of Onset   Diabetes Mother    Ovarian cancer Mother    Colon cancer Father    Diabetes Father    Diabetes Sister    Diabetes Sister    Diabetes Sister    Diabetes Sister    Esophageal cancer Neg Hx    Breast cancer Neg Hx     Social History Social History   Tobacco Use   Smoking status: Never   Smokeless tobacco: Never  Vaping Use   Vaping Use: Never used  Substance Use Topics   Alcohol use: Yes    Alcohol/week: 0.0 standard drinks    Comment: occasional wine   Drug use: No     Allergies   Patient has no known  allergies.   Review of Systems Review of Systems Per HPI  Physical Exam Triage Vital Signs ED Triage Vitals  Enc Vitals Group     BP 12/02/21 1352 138/76     Pulse Rate 12/02/21 1352 72     Resp 12/02/21 1352 18     Temp 12/02/21 1352 98.3 F (36.8 C)     Temp Source 12/02/21 1352 Oral     SpO2 12/02/21 1352 96 %     Weight --      Height --      Head Circumference --      Peak Flow --      Pain Score 12/02/21 1354 0     Pain Loc --      Pain Edu? --      Excl. in [place]? --    No data found.  Updated Vital Signs BP 138/76 (BP Location: Left Arm)    Pulse 72    Temp 98.3 F (36.8 C) (Oral)    Resp 18    SpO2 96%   Visual Acuity Right Eye Distance:   Left Eye Distance:   Bilateral Distance:    Right Eye Near:   Left Eye Near:    Bilateral Near:     Physical Exam Constitutional:      General: She is not in acute distress.    Appearance: Normal appearance. She is not toxic-appearing or diaphoretic.  HENT:     Head: Normocephalic and atraumatic.  Eyes:     Extraocular Movements: Extraocular movements intact.     Conjunctiva/sclera: Conjunctivae normal.  Pulmonary:     Effort: Pulmonary effort is normal.  Skin:    Findings: Burn present.          Comments: Approximately 1 cm in diameter intact blister present to right upper thigh with surrounding erythema that is present throughout right upper thigh.  Very mild erythema.  There is also very mild erythema located to the pads at distal end of third and fourth digit.  No blisters noted to fingers.  Patient has full range of motion of fingers.  Neurological:     General: No focal deficit present.     Mental Status: She is alert and oriented to person, place, and time. Mental status is at baseline.  Psychiatric:        Mood and Affect: Mood normal.        Behavior: Behavior normal.  Thought Content: Thought content normal.        Judgment: Judgment normal.     UC Treatments / Results  Labs (all labs  ordered are listed, but only abnormal results are displayed) Labs Reviewed - No data to display  EKG   Radiology No results found.  Procedures Procedures (including critical care time)  Medications Ordered in UC Medications - No data to display  Initial Impression / Assessment and Plan / UC Course  I have reviewed the triage vital signs and the nursing notes.  Pertinent labs & imaging results that were available during my care of the patient were reviewed by me and considered in my medical decision making (see chart for details).     Patient has 2 very mild superficial first-degree burns with one being to right upper thigh and one being to fingers of right hand.  Nonadherent dressing applied over blister area to right thigh.  Will opt to not pop blister given possibility and risk of infection.  Patient to monitor for signs of infection and to follow-up if these occur.  Discussed supportive care, cool compresses, pain relief with patient.  Patient prescribed silver sulfadiazine cream to help prevent infection.  Discussed strict return precautions.  Patient verbalized understanding and was agreeable with plan. Final Clinical Impressions(s) / UC Diagnoses   Final diagnoses:  Superficial burn of right thigh, initial encounter  Burn of fingers     Discharge Instructions      You have been prescribed antibiotic cream to apply to burn.  Please do not pop blister.  If blister pops, please keep covered until healed.  Monitor for signs of infection include increased redness, swelling, drainage.  Follow-up with this occurs.    ED Prescriptions     Medication Sig Dispense Auth. Provider   silver sulfADIAZINE (SILVADENE) 1 % cream Apply 1 application topically daily. 50 g Teodora Medici, [place]      PDMP not reviewed this encounter.   Teodora Medici, [place] 12/02/21 1416    Teodora Medici, [place] 12/02/21 1416

## 2021-12-26 ENCOUNTER — Encounter: Payer: Self-pay | Admitting: Neurology

## 2021-12-26 DIAGNOSIS — I10 Essential (primary) hypertension: Secondary | ICD-10-CM | POA: Diagnosis not present

## 2021-12-26 DIAGNOSIS — R519 Headache, unspecified: Secondary | ICD-10-CM | POA: Diagnosis not present

## 2022-01-12 DIAGNOSIS — H52223 Regular astigmatism, bilateral: Secondary | ICD-10-CM | POA: Diagnosis not present

## 2022-01-12 DIAGNOSIS — H524 Presbyopia: Secondary | ICD-10-CM | POA: Diagnosis not present

## 2022-01-12 DIAGNOSIS — H5203 Hypermetropia, bilateral: Secondary | ICD-10-CM | POA: Diagnosis not present

## 2022-01-12 DIAGNOSIS — H43812 Vitreous degeneration, left eye: Secondary | ICD-10-CM | POA: Diagnosis not present

## 2022-01-12 DIAGNOSIS — Z961 Presence of intraocular lens: Secondary | ICD-10-CM | POA: Diagnosis not present

## 2022-01-12 DIAGNOSIS — E119 Type 2 diabetes mellitus without complications: Secondary | ICD-10-CM | POA: Diagnosis not present

## 2022-01-12 DIAGNOSIS — H25813 Combined forms of age-related cataract, bilateral: Secondary | ICD-10-CM | POA: Diagnosis not present

## 2022-03-16 DIAGNOSIS — L820 Inflamed seborrheic keratosis: Secondary | ICD-10-CM | POA: Diagnosis not present

## 2022-03-16 DIAGNOSIS — L298 Other pruritus: Secondary | ICD-10-CM | POA: Diagnosis not present

## 2022-03-16 DIAGNOSIS — L538 Other specified erythematous conditions: Secondary | ICD-10-CM | POA: Diagnosis not present

## 2022-04-12 ENCOUNTER — Ambulatory Visit: Admitting: Neurology

## 2022-05-01 DIAGNOSIS — M545 Low back pain, unspecified: Secondary | ICD-10-CM | POA: Diagnosis not present

## 2022-06-12 ENCOUNTER — Encounter: Payer: Self-pay | Admitting: Emergency Medicine

## 2022-06-12 ENCOUNTER — Ambulatory Visit
Admission: EM | Admit: 2022-06-12 | Discharge: 2022-06-12 | Disposition: A | Discharge status: Home / Self Care | Attending: Internal Medicine | Admitting: Internal Medicine

## 2022-06-12 DIAGNOSIS — R519 Headache, unspecified: Secondary | ICD-10-CM

## 2022-06-12 DIAGNOSIS — M542 Cervicalgia: Secondary | ICD-10-CM

## 2022-06-12 MED ORDER — BACLOFEN 5 MG PO TABS: 5.0000 mg | ORAL_TABLET | Freq: Three times a day (TID) | ORAL | 0 refills | Status: AC | PRN

## 2022-06-12 NOTE — ED Provider Notes (Signed)
[name]    CSN: [id number] Arrival date & time: 06/12/22  1141      History   Chief Complaint Chief Complaint  Patient presents with   Headache    HPI [name] is a 76 y.o. female.   Patient presents today with headache, right-sided neck pain, and concerns for high blood pressure.  Patient reports that headache started about a week ago.  She developed some right-sided neck discomfort about the same time as well.  Denies any recent falls or head injuries.  Denies history of migraines.  Patient rates head pain 8/10 on pain scale and states that it is generalized headache.  Patient is concerned that her blood pressure could be causing her symptoms given that she checked it this morning, and it was 269S systolic.  She currently takes losartan and metoprolol for blood pressure.  She has not taken any medication to alleviate her discomfort.  She has not contacted PCP as well.  Denies any associated dizziness, blurred vision, nausea, vomiting, chest pain, shortness of breath.    Headache   Past Medical History:  Diagnosis Date   Anemia    Anxiety    Arthritis    Complication of anesthesia years ago   jittery and nervous after 1 d and c   Diabetes mellitus    type 2   Esophageal stricture    GERD (gastroesophageal reflux disease)    Heart palpitations    heart palpitaions last 2 weeks, family md  dr polite aware no ekg done   Hypertension    Sleep apnea    does not tolerate cpap   UTI (urinary tract infection)     Patient Active Problem List   Diagnosis Date Noted   Left leg pain 08/03/2015   Left knee pain 05/19/2015    Past Surgical History:  Procedure Laterality Date   CESAREAN SECTION     x 1   CHOLECYSTECTOMY     COLONOSCOPY WITH PROPOFOL N/A 09/14/2014   Procedure: COLONOSCOPY WITH PROPOFOL;  Surgeon: Garlan Fair, MD;  Location: [facility];  Service: Endoscopy;  Laterality: N/A;   DILATION AND CURETTAGE OF UTERUS     2 or 3  times   ESOPHAGOGASTRODUODENOSCOPY     said she had one many years ago but can't remember the year   surgery for leg fracture Right    TUBAL LIGATION      OB History   No obstetric history on file.      Home Medications    Prior to Admission medications   Medication Sig Start Date End Date Taking? Authorizing Provider  baclofen 5 MG TABS Take 5 mg by mouth 3 (three) times daily as needed for muscle spasms. 06/12/22  Yes Teodora Medici, FNP  ACCU-CHEK SMARTVIEW test strip  04/19/15   [provider]  AIMSCO INSULIN SYR ULTRA THIN 31G X 5/16" 0.3 ML MISC  04/19/15   [provider]  benzonatate (TESSALON) 100 MG capsule Take 1 capsule (100 mg total) by mouth every 8 (eight) hours as needed for cough. 10/12/21   Teodora Medici, FNP  Blood Glucose Monitoring Suppl (ACCU-CHEK NANO SMARTVIEW) W/DEVICE KIT  04/19/15   [provider]  FLUoxetine (PROZAC) 40 MG capsule Take 40 mg by mouth every morning.     [provider]  insulin glargine (LANTUS) 100 UNIT/ML injection Inject 40 Units into the skin every morning.     [provider]  Lancet Devices (SIMPLE  DIAGNOSTICS LANCING DEV) MISC  04/19/15   [provider]  lidocaine (XYLOCAINE) 2 % solution Use as directed 10 mLs in the mouth or throat as needed for mouth pain. 05/11/21   Volney American, PA-C  losartan (COZAAR) 25 MG tablet  06/26/18   [provider]  metoprolol (LOPRESSOR) 50 MG tablet Take 50 mg by mouth every morning.     [provider]  omeprazole (PRILOSEC) 20 MG capsule  05/14/15   [provider]  oseltamivir (TAMIFLU) 75 MG capsule Take 1 capsule (75 mg total) by mouth every 12 (twelve) hours. 10/12/21   Teodora Medici, FNP  potassium chloride SA (KLOR-CON) 20 MEQ tablet Take 1 tablet (20 mEq total) by mouth 2 (two) times daily. 02/14/20   Daleen Bo, MD  silver sulfADIAZINE (SILVADENE) 1 % cream Apply 1 application topically daily. 12/02/21    Teodora Medici, FNP  sitaGLIPtin (JANUVIA) 100 MG tablet Take 100 mg by mouth every morning.    [provider]    Family History Family History  Problem Relation Age of Onset   Diabetes Mother    Ovarian cancer Mother    Colon cancer Father    Diabetes Father    Diabetes Sister    Diabetes Sister    Diabetes Sister    Diabetes Sister    Esophageal cancer Neg Hx    Breast cancer Neg Hx     Social History Social History   Tobacco Use   Smoking status: Never   Smokeless tobacco: Never  Vaping Use   Vaping Use: Never used  Substance Use Topics   Alcohol use: Yes    Alcohol/week: 0.0 standard drinks of alcohol    Comment: occasional wine   Drug use: No     Allergies   Patient has no known allergies.   Review of Systems Review of Systems Per HPI  Physical Exam Triage Vital Signs ED Triage Vitals  Enc Vitals Group     BP 06/12/22 1218 (!) 154/96     Pulse Rate 06/12/22 1218 66     Resp 06/12/22 1218 17     Temp 06/12/22 1218 98.3 F (36.8 C)     Temp src --      SpO2 06/12/22 1218 96 %     Weight --      Height --      Head Circumference --      Peak Flow --      Pain Score 06/12/22 1217 8     Pain Loc --      Pain Edu? --      Excl. in [place]? --    No data found.  Updated Vital Signs BP (!) 146/94   Pulse 66   Temp 98.3 F (36.8 C)   Resp 17   SpO2 96%   Visual Acuity Right Eye Distance:   Left Eye Distance:   Bilateral Distance:    Right Eye Near:   Left Eye Near:    Bilateral Near:     Physical Exam Constitutional:      General: She is not in acute distress.    Appearance: Normal appearance. She is not toxic-appearing or diaphoretic.  HENT:     Head: Normocephalic and atraumatic.  Eyes:     Extraocular Movements: Extraocular movements intact.     Conjunctiva/sclera: Conjunctivae normal.     Pupils: Pupils are equal, round, and reactive to light.  Neck:      Comments: Patient  reports "discomfort" with palpation to right  lateral neck muscles.  No obvious swelling, discoloration, warmth, lacerations, abrasions noted.  Patient has full range of motion of neck.  No direct spinal tenderness, crepitus, step-off noted. Cardiovascular:     Rate and Rhythm: Normal rate and regular rhythm.     Pulses: Normal pulses.     Heart sounds: Normal heart sounds.  Pulmonary:     Effort: Pulmonary effort is normal. No respiratory distress.     Breath sounds: Normal breath sounds.  Neurological:     General: No focal deficit present.     Mental Status: She is alert and oriented to person, place, and time. Mental status is at baseline.     Cranial Nerves: Cranial nerves 2-12 are intact.     Sensory: Sensation is intact.     Motor: Motor function is intact.     Coordination: Coordination is intact.     Gait: Gait is intact.  Psychiatric:        Mood and Affect: Mood normal.        Behavior: Behavior normal.        Thought Content: Thought content normal.        Judgment: Judgment normal.      UC Treatments / Results  Labs (all labs ordered are listed, but only abnormal results are displayed) Labs Reviewed - No data to display  EKG   Radiology No results found.  Procedures Procedures (including critical care time)  Medications Ordered in UC Medications - No data to display  Initial Impression / Assessment and Plan / UC Course  I have reviewed the triage vital signs and the nursing notes.  Pertinent labs & imaging results that were available during my care of the patient were reviewed by me and considered in my medical decision making (see chart for details).     Given physical exam, I am highly suspicious that patient's neck pain/headache is due to musculoskeletal pain.  Will treat with muscle relaxer.  Advised patient that muscle relaxer can cause drowsiness.  Given no obvious injury will defer imaging.  Patient's blood pressure is slightly elevated but is not significant here in urgent care.  Low  suspicion that patient symptoms are related to her blood pressure but patient was encouraged to follow-up with PCP today for further evaluation and management of this.  Patient encouraged to continue to check blood pressure as well.  Patient does not have any associated symptoms with headache, vital signs are stable, neuro exam is normal and no signs of endorgan damage so do not think that emergent evaluation or imaging of the head is necessary at this time.  Although, patient was advised to go to the ER if no improvement in symptoms in the next 24 to 48 hours.  Patient verbalized understanding was agreeable with plan. Final Clinical Impressions(s) / UC Diagnoses   Final diagnoses:  Acute intractable headache, unspecified headache type  Neck pain     Discharge Instructions      I am suspicious that your pain is musculoskeletal in nature.  You have been prescribed a muscle relaxer to take as needed.  Please be advised that muscle relaxer can cause drowsiness so do not drive or drink alcohol with it.  Please follow-up with your primary care doctor today as well given your concern for high blood pressure.  If your headache persists or worsens or if you develop dizziness, blurred vision, nausea, vomiting, chest pain, shortness of breath please go to  the emergency department as soon as possible.    ED Prescriptions     Medication Sig Dispense Auth. Provider   baclofen 5 MG TABS Take 5 mg by mouth 3 (three) times daily as needed for muscle spasms. 30 tablet [place], Michele Rockers, [place]      PDMP not reviewed this encounter.   Teodora Medici, [place] 06/12/22 1344

## 2022-06-12 NOTE — ED Triage Notes (Signed)
Pt is present today with c/o HA and HTN. Pt states that she is also having pain on the right side of her neck. Pt sx started one week ago.

## 2022-06-12 NOTE — Discharge Instructions (Signed)
I am suspicious that your pain is musculoskeletal in nature.  You have been prescribed a muscle relaxer to take as needed.  Please be advised that muscle relaxer can cause drowsiness so do not drive or drink alcohol with it.  Please follow-up with your primary care doctor today as well given your concern for high blood pressure.  If your headache persists or worsens or if you develop dizziness, blurred vision, nausea, vomiting, chest pain, shortness of breath please go to the emergency department as soon as possible.

## 2022-08-03 DIAGNOSIS — Z0000 Encounter for general adult medical examination without abnormal findings: Secondary | ICD-10-CM | POA: Diagnosis not present

## 2022-08-03 DIAGNOSIS — I700 Atherosclerosis of aorta: Secondary | ICD-10-CM | POA: Diagnosis not present

## 2022-08-03 DIAGNOSIS — Z5181 Encounter for therapeutic drug level monitoring: Secondary | ICD-10-CM | POA: Diagnosis not present

## 2022-08-03 DIAGNOSIS — I10 Essential (primary) hypertension: Secondary | ICD-10-CM | POA: Diagnosis not present

## 2022-08-03 DIAGNOSIS — G4733 Obstructive sleep apnea (adult) (pediatric): Secondary | ICD-10-CM | POA: Diagnosis not present

## 2022-08-03 DIAGNOSIS — E7800 Pure hypercholesterolemia, unspecified: Secondary | ICD-10-CM | POA: Diagnosis not present

## 2022-08-03 DIAGNOSIS — E1169 Type 2 diabetes mellitus with other specified complication: Secondary | ICD-10-CM | POA: Diagnosis not present

## 2022-08-03 DIAGNOSIS — I5189 Other ill-defined heart diseases: Secondary | ICD-10-CM | POA: Diagnosis not present

## 2022-08-03 DIAGNOSIS — Z794 Long term (current) use of insulin: Secondary | ICD-10-CM | POA: Diagnosis not present

## 2022-08-28 DIAGNOSIS — Z800 Family history of malignant neoplasm of digestive organs: Secondary | ICD-10-CM | POA: Diagnosis not present

## 2022-08-28 DIAGNOSIS — R10812 Left upper quadrant abdominal tenderness: Secondary | ICD-10-CM | POA: Diagnosis not present

## 2022-08-28 DIAGNOSIS — R10811 Right upper quadrant abdominal tenderness: Secondary | ICD-10-CM | POA: Diagnosis not present

## 2022-08-28 DIAGNOSIS — R109 Unspecified abdominal pain: Secondary | ICD-10-CM | POA: Diagnosis not present

## 2022-08-28 DIAGNOSIS — K219 Gastro-esophageal reflux disease without esophagitis: Secondary | ICD-10-CM | POA: Diagnosis not present

## 2022-08-28 DIAGNOSIS — R10816 Epigastric abdominal tenderness: Secondary | ICD-10-CM | POA: Diagnosis not present

## 2022-08-28 DIAGNOSIS — K639 Disease of intestine, unspecified: Secondary | ICD-10-CM | POA: Diagnosis not present

## 2022-10-03 ENCOUNTER — Other Ambulatory Visit: Payer: Self-pay | Admitting: Physician Assistant

## 2022-10-03 ENCOUNTER — Ambulatory Visit
Admission: RE | Admit: 2022-10-03 | Discharge: 2022-10-03 | Disposition: A | Discharge status: Home / Self Care | Source: Ambulatory Visit | Attending: Physician Assistant | Admitting: Physician Assistant

## 2022-10-03 ENCOUNTER — Other Ambulatory Visit (HOSPITAL_COMMUNITY): Payer: Self-pay | Admitting: Physician Assistant

## 2022-10-03 DIAGNOSIS — K5900 Constipation, unspecified: Secondary | ICD-10-CM | POA: Diagnosis not present

## 2022-10-03 DIAGNOSIS — R110 Nausea: Secondary | ICD-10-CM | POA: Diagnosis not present

## 2022-10-03 DIAGNOSIS — R195 Other fecal abnormalities: Secondary | ICD-10-CM | POA: Diagnosis not present

## 2022-10-03 DIAGNOSIS — K219 Gastro-esophageal reflux disease without esophagitis: Secondary | ICD-10-CM | POA: Diagnosis not present

## 2022-10-03 DIAGNOSIS — R1013 Epigastric pain: Secondary | ICD-10-CM | POA: Diagnosis not present

## 2022-10-03 DIAGNOSIS — R109 Unspecified abdominal pain: Secondary | ICD-10-CM | POA: Diagnosis not present

## 2022-10-05 DIAGNOSIS — R195 Other fecal abnormalities: Secondary | ICD-10-CM | POA: Diagnosis not present

## 2022-10-13 ENCOUNTER — Encounter (HOSPITAL_COMMUNITY)

## 2022-10-15 ENCOUNTER — Ambulatory Visit
Admission: EM | Admit: 2022-10-15 | Discharge: 2022-10-15 | Disposition: A | Discharge status: Home / Self Care | Payer: Medicare Other | Attending: Physician Assistant | Admitting: Physician Assistant

## 2022-10-15 DIAGNOSIS — J069 Acute upper respiratory infection, unspecified: Secondary | ICD-10-CM

## 2022-10-15 DIAGNOSIS — U071 COVID-19: Secondary | ICD-10-CM | POA: Insufficient documentation

## 2022-10-15 MED ORDER — BENZONATATE 100 MG PO CAPS: 100.0000 mg | ORAL_CAPSULE | Freq: Three times a day (TID) | ORAL | 0 refills | Status: DC

## 2022-10-15 MED ORDER — FLUTICASONE PROPIONATE 50 MCG/ACT NA SUSP: 1.0000 | Freq: Every day | NASAL | 0 refills | Status: AC

## 2022-10-15 NOTE — Discharge Instructions (Addendum)
Monitor your MyChart for your COVID results.  I do think it is reasonable to start a medication if you are positive for COVID such as molnupiravir.  If you are open to this please let our nurse know when they call you with your results (they will only call if you are positive).  Use over-the-counter medications including Tylenol, Mucinex for symptom relief.  Use Flonase and Tessalon as prescribed to help with your congestion and cough.  Gargle with warm salt water.  Make sure that you rest and drink plenty of fluid.  If your symptoms are improving by next week or if anything worsens you need to be seen immediately.  I do recommend that you obtain a pulse oximeter from the pharmacy and monitor your oxygen saturation.  If this drops below 90% you must go to the emergency room immediately.

## 2022-10-15 NOTE — ED Provider Notes (Signed)
[name]    CSN: [id number] Arrival date & time: 10/15/22  0913      History   Chief Complaint Chief Complaint  Patient presents with   Headache   Cough   Chills    HPI [name] is a 76 y.o. female.   Patient presents today with 3-day history of URI symptoms including cough, body aches, sore throat, nasal congestion, headache.  Denies any fever, chest pain, shortness of breath,vomiting, diarrhea.  Denies any known sick contacts.  She did take an at-home COVID test that was positive but does not trust this result as the testing kit was expired.  She is requesting a repeat COVID test in clinic today.  She has had her COVID-vaccine as well as influenza shot.  She does have a history of diabetes but reports her blood sugars are well-controlled with fasting blood sugar of 145 today.  She denies any history of asthma, COPD, smoking.  She has tried over-the-counter antihistamines and Tylenol without improvement of symptoms.  She is able to eat and drink despite symptoms.    Past Medical History:  Diagnosis Date   Anemia    Anxiety    Arthritis    Complication of anesthesia years ago   jittery and nervous after 1 d and c   Diabetes mellitus    type 2   Esophageal stricture    GERD (gastroesophageal reflux disease)    Heart palpitations    heart palpitaions last 2 weeks, family md  dr polite aware no ekg done   Hypertension    Sleep apnea    does not tolerate cpap   UTI (urinary tract infection)     Patient Active Problem List   Diagnosis Date Noted   Left leg pain 08/03/2015   Left knee pain 05/19/2015    Past Surgical History:  Procedure Laterality Date   CESAREAN SECTION     x 1   CHOLECYSTECTOMY     COLONOSCOPY WITH PROPOFOL N/A 09/14/2014   Procedure: COLONOSCOPY WITH PROPOFOL;  Surgeon: Garlan Fair, MD;  Location: [facility];  Service: Endoscopy;  Laterality: N/A;   DILATION AND CURETTAGE OF UTERUS     2 or 3 times    ESOPHAGOGASTRODUODENOSCOPY     said she had one many years ago but can't remember the year   surgery for leg fracture Right    TUBAL LIGATION      OB History   No obstetric history on file.      Home Medications    Prior to Admission medications   Medication Sig Start Date End Date Taking? Authorizing Provider  benzonatate (TESSALON) 100 MG capsule Take 1 capsule (100 mg total) by mouth every 8 (eight) hours. 10/15/22  Yes Aradhana Gin K, PA-C  fluticasone (FLONASE) 50 MCG/ACT nasal spray Place 1 spray into both nostrils daily. 10/15/22  Yes Konya Fauble, Derry Skill, PA-C  ACCU-CHEK SMARTVIEW test strip  04/19/15   [provider]  AIMSCO INSULIN SYR ULTRA THIN 31G X 5/16" 0.3 ML MISC  04/19/15   [provider]  baclofen 5 MG TABS Take 5 mg by mouth 3 (three) times daily as needed for muscle spasms. 06/12/22   Teodora Medici, FNP  Blood Glucose Monitoring Suppl (ACCU-CHEK NANO SMARTVIEW) W/DEVICE KIT  04/19/15   [provider]  FLUoxetine (PROZAC) 40 MG capsule Take 40 mg by mouth every morning.     [provider]  insulin glargine (LANTUS) 100 UNIT/ML injection Inject  40 Units into the skin every morning.     [provider]  Lancet Devices (SIMPLE DIAGNOSTICS LANCING DEV) MISC  04/19/15   [provider]  lidocaine (XYLOCAINE) 2 % solution Use as directed 10 mLs in the mouth or throat as needed for mouth pain. 05/11/21   Volney American, PA-C  losartan (COZAAR) 25 MG tablet  06/26/18   [provider]  metoprolol (LOPRESSOR) 50 MG tablet Take 50 mg by mouth every morning.     [provider]  omeprazole (PRILOSEC) 20 MG capsule  05/14/15   [provider]  potassium chloride SA (KLOR-CON) 20 MEQ tablet Take 1 tablet (20 mEq total) by mouth 2 (two) times daily. 02/14/20   Daleen Bo, MD  silver sulfADIAZINE (SILVADENE) 1 % cream Apply 1 application topically daily. 12/02/21   Teodora Medici, FNP  sitaGLIPtin  (JANUVIA) 100 MG tablet Take 100 mg by mouth every morning.    [provider]    Family History Family History  Problem Relation Age of Onset   Diabetes Mother    Ovarian cancer Mother    Colon cancer Father    Diabetes Father    Diabetes Sister    Diabetes Sister    Diabetes Sister    Diabetes Sister    Esophageal cancer Neg Hx    Breast cancer Neg Hx     Social History Social History   Tobacco Use   Smoking status: Never   Smokeless tobacco: Never  Vaping Use   Vaping Use: Never used  Substance Use Topics   Alcohol use: Yes    Alcohol/week: 0.0 standard drinks of alcohol    Comment: occasional wine   Drug use: No     Allergies   Patient has no known allergies.   Review of Systems Review of Systems  Constitutional:  Positive for activity change. Negative for appetite change, fatigue and fever.  HENT:  Positive for congestion and sore throat. Negative for sinus pressure and sneezing.   Respiratory:  Positive for cough. Negative for shortness of breath.   Cardiovascular:  Negative for chest pain.  Gastrointestinal:  Positive for nausea. Negative for abdominal pain, diarrhea and vomiting.  Musculoskeletal:  Positive for arthralgias and myalgias.  Neurological:  Positive for headaches. Negative for dizziness and light-headedness.     Physical Exam Triage Vital Signs ED Triage Vitals  Enc Vitals Group     BP 10/15/22 1126 (!) 144/88     Pulse Rate 10/15/22 1125 93     Resp 10/15/22 1125 17     Temp 10/15/22 1125 98.8 F (37.1 C)     Temp Source 10/15/22 1125 Oral     SpO2 10/15/22 1125 92 %     Weight --      Height --      Head Circumference --      Peak Flow --      Pain Score 10/15/22 1124 6     Pain Loc --      Pain Edu? --      Excl. in [place]? --    No data found.  Updated Vital Signs BP 123/86 (BP Location: Left Arm)   Pulse 93   Temp 98.8 F (37.1 C) (Oral)   Resp 17   SpO2 92%   Visual Acuity Right Eye Distance:   Left Eye  Distance:   Bilateral Distance:    Right Eye Near:   Left Eye Near:    Bilateral Near:  Physical Exam Vitals reviewed.  Constitutional:      General: She is awake. She is not in acute distress.    Appearance: Normal appearance. She is well-developed. She is not ill-appearing.     Comments: Very pleasant female appears stated age in no acute distress sitting comfortably in exam room  HENT:     Head: Normocephalic and atraumatic.     Right Ear: Tympanic membrane, ear canal and external ear normal. Tympanic membrane is not erythematous or bulging.     Left Ear: Tympanic membrane, ear canal and external ear normal. Tympanic membrane is not erythematous or bulging.     Nose:     Right Sinus: No maxillary sinus tenderness or frontal sinus tenderness.     Left Sinus: No maxillary sinus tenderness or frontal sinus tenderness.     Mouth/Throat:     Pharynx: Uvula midline. Posterior oropharyngeal erythema present. No oropharyngeal exudate.  Cardiovascular:     Rate and Rhythm: Normal rate and regular rhythm.     Heart sounds: Normal heart sounds, S1 normal and S2 normal. No murmur heard. Pulmonary:     Effort: Pulmonary effort is normal.     Breath sounds: Normal breath sounds. No wheezing, rhonchi or rales.     Comments: Clear to auscultation bilaterally Lymphadenopathy:     Head:     Right side of head: No submental, submandibular or tonsillar adenopathy.     Left side of head: No submental, submandibular or tonsillar adenopathy.     Cervical: No cervical adenopathy.  Psychiatric:        Behavior: Behavior is cooperative.      UC Treatments / Results  Labs (all labs ordered are listed, but only abnormal results are displayed) Labs Reviewed  SARS CORONAVIRUS 2 (TAT 6-24 HRS)    EKG   Radiology No results found.  Procedures Procedures (including critical care time)  Medications Ordered in UC Medications - No data to display  Initial Impression / Assessment and Plan  / UC Course  I have reviewed the triage vital signs and the nursing notes.  Pertinent labs & imaging results that were available during my care of the patient were reviewed by me and considered in my medical decision making (see chart for details).  Clinical Course as of 10/15/22 1159  Sun Oct 15, 2022  1157 SpO2: 92 % [ER]    Clinical Course User Index [ER] Jezelle Gullick, Derry Skill, PA-C    Patient is well-appearing, afebrile, nontoxic, nontachycardic.  Oxygen saturation was slightly low for her but she denies any significant shortness of breath.  Recommended that she obtain a pulse oximeter and monitor this at home with instruction to go to the emergency room if this drops below 90%.  Discussed that she is likely positive for COVID given her at home COVID test that was positive, however, she requested we repeat her test that she did not trust this result.  We discussed that it is reasonable to start molnupiravir, however, she declined to start this medication until she confirms that she is positive for COVID.  I would recommend this medication over Paxlovid as we do not have a recent kidney function.  We did discuss conservative treatment measures including Tylenol, Mucinex, Flonase.  She was prescribed Tessalon to help with her cough.  Recommend that she gargle with warm salt water.  She is to rest and drink plenty of fluid.  Discussed that if her symptoms do not improving within a few days she should  return for reevaluation.  If she has any worsening symptoms including shortness of breath, chest pain, nausea/vomiting interfere with oral intake, weakness, worsening cough she needs to go to the emergency room to which she expressed understanding.  Strict return precautions given.  Final Clinical Impressions(s) / UC Diagnoses   Final diagnoses:  Upper respiratory tract infection, unspecified type     Discharge Instructions      Monitor your MyChart for your COVID results.  I do think it is reasonable  to start a medication if you are positive for COVID such as molnupiravir.  If you are open to this please let our nurse know when they call you with your results (they will only call if you are positive).  Use over-the-counter medications including Tylenol, Mucinex for symptom relief.  Use Flonase and Tessalon as prescribed to help with your congestion and cough.  Gargle with warm salt water.  Make sure that you rest and drink plenty of fluid.  If your symptoms are improving by next week or if anything worsens you need to be seen immediately.  I do recommend that you obtain a pulse oximeter from the pharmacy and monitor your oxygen saturation.  If this drops below 90% you must go to the emergency room immediately.     ED Prescriptions     Medication Sig Dispense Auth. Provider   benzonatate (TESSALON) 100 MG capsule Take 1 capsule (100 mg total) by mouth every 8 (eight) hours. 21 capsule Chrystian Ressler K, PA-C   fluticasone (FLONASE) 50 MCG/ACT nasal spray Place 1 spray into both nostrils daily. 16 g Anshika Pethtel K, PA-C      PDMP not reviewed this encounter.   Terrilee Croak, PA-C 10/15/22 1159

## 2022-10-15 NOTE — ED Triage Notes (Signed)
Pt presents with scratchy throat, generalized body aches, non productive cough, headache, and chills X 3 days.

## 2022-10-16 LAB — SARS CORONAVIRUS 2 (TAT 6-24 HRS): SARS Coronavirus 2: POSITIVE — AB

## 2022-10-24 ENCOUNTER — Other Ambulatory Visit: Payer: Self-pay | Admitting: Physician Assistant

## 2022-10-24 DIAGNOSIS — K8689 Other specified diseases of pancreas: Secondary | ICD-10-CM

## 2022-10-24 DIAGNOSIS — R110 Nausea: Secondary | ICD-10-CM

## 2022-10-24 DIAGNOSIS — R1010 Upper abdominal pain, unspecified: Secondary | ICD-10-CM

## 2022-10-27 ENCOUNTER — Ambulatory Visit (HOSPITAL_COMMUNITY)
Admission: RE | Admit: 2022-10-27 | Discharge: 2022-10-27 | Disposition: A | Discharge status: Home / Self Care | Payer: Medicare Other | Source: Ambulatory Visit | Attending: Physician Assistant | Admitting: Physician Assistant

## 2022-10-27 DIAGNOSIS — R110 Nausea: Secondary | ICD-10-CM | POA: Insufficient documentation

## 2022-10-27 DIAGNOSIS — K30 Functional dyspepsia: Secondary | ICD-10-CM | POA: Diagnosis not present

## 2022-10-27 DIAGNOSIS — K3184 Gastroparesis: Secondary | ICD-10-CM | POA: Diagnosis not present

## 2022-10-27 MED ORDER — TECHNETIUM TC 99M SULFUR COLLOID
2.0000 | Freq: Once | INTRAVENOUS | Status: AC | PRN
  Administered 2022-10-27: 2 via ORAL

## 2022-11-13 DIAGNOSIS — K8689 Other specified diseases of pancreas: Secondary | ICD-10-CM | POA: Diagnosis not present

## 2022-11-13 DIAGNOSIS — E1169 Type 2 diabetes mellitus with other specified complication: Secondary | ICD-10-CM | POA: Diagnosis not present

## 2022-11-13 DIAGNOSIS — I700 Atherosclerosis of aorta: Secondary | ICD-10-CM | POA: Diagnosis not present

## 2022-11-13 DIAGNOSIS — R82998 Other abnormal findings in urine: Secondary | ICD-10-CM | POA: Diagnosis not present

## 2022-11-13 DIAGNOSIS — K3184 Gastroparesis: Secondary | ICD-10-CM | POA: Diagnosis not present

## 2022-11-22 ENCOUNTER — Ambulatory Visit
Admission: RE | Admit: 2022-11-22 | Discharge: 2022-11-22 | Disposition: A | Discharge status: Home / Self Care | Payer: Medicare Other | Source: Ambulatory Visit | Attending: Physician Assistant | Admitting: Physician Assistant

## 2022-11-22 DIAGNOSIS — K6389 Other specified diseases of intestine: Secondary | ICD-10-CM | POA: Diagnosis not present

## 2022-11-22 DIAGNOSIS — R110 Nausea: Secondary | ICD-10-CM | POA: Diagnosis not present

## 2022-11-22 DIAGNOSIS — K449 Diaphragmatic hernia without obstruction or gangrene: Secondary | ICD-10-CM | POA: Diagnosis not present

## 2022-11-22 DIAGNOSIS — R1010 Upper abdominal pain, unspecified: Secondary | ICD-10-CM

## 2022-11-22 DIAGNOSIS — K8689 Other specified diseases of pancreas: Secondary | ICD-10-CM

## 2022-11-22 DIAGNOSIS — K5730 Diverticulosis of large intestine without perforation or abscess without bleeding: Secondary | ICD-10-CM | POA: Diagnosis not present

## 2022-11-22 MED ORDER — IOPAMIDOL (ISOVUE-300) INJECTION 61%
100.0000 mL | Freq: Once | INTRAVENOUS | Status: AC | PRN
  Administered 2022-11-22: 100 mL via INTRAVENOUS

## 2022-11-23 DIAGNOSIS — H04123 Dry eye syndrome of bilateral lacrimal glands: Secondary | ICD-10-CM | POA: Diagnosis not present

## 2022-11-24 ENCOUNTER — Other Ambulatory Visit: Payer: Self-pay | Admitting: Physician Assistant

## 2022-11-24 DIAGNOSIS — R935 Abnormal findings on diagnostic imaging of other abdominal regions, including retroperitoneum: Secondary | ICD-10-CM

## 2022-11-30 DIAGNOSIS — R1010 Upper abdominal pain, unspecified: Secondary | ICD-10-CM | POA: Diagnosis not present

## 2022-12-05 DIAGNOSIS — R110 Nausea: Secondary | ICD-10-CM | POA: Diagnosis not present

## 2022-12-05 DIAGNOSIS — K2970 Gastritis, unspecified, without bleeding: Secondary | ICD-10-CM | POA: Diagnosis not present

## 2022-12-05 DIAGNOSIS — K317 Polyp of stomach and duodenum: Secondary | ICD-10-CM | POA: Diagnosis not present

## 2022-12-05 DIAGNOSIS — R1010 Upper abdominal pain, unspecified: Secondary | ICD-10-CM | POA: Diagnosis not present

## 2022-12-05 DIAGNOSIS — K449 Diaphragmatic hernia without obstruction or gangrene: Secondary | ICD-10-CM | POA: Diagnosis not present

## 2022-12-07 DIAGNOSIS — K317 Polyp of stomach and duodenum: Secondary | ICD-10-CM | POA: Diagnosis not present

## 2022-12-15 ENCOUNTER — Ambulatory Visit
Admission: RE | Admit: 2022-12-15 | Discharge: 2022-12-15 | Disposition: A | Discharge status: Home / Self Care | Source: Ambulatory Visit | Attending: Physician Assistant | Admitting: Physician Assistant

## 2022-12-15 DIAGNOSIS — R935 Abnormal findings on diagnostic imaging of other abdominal regions, including retroperitoneum: Secondary | ICD-10-CM

## 2022-12-15 DIAGNOSIS — K449 Diaphragmatic hernia without obstruction or gangrene: Secondary | ICD-10-CM | POA: Diagnosis not present

## 2022-12-28 DIAGNOSIS — E119 Type 2 diabetes mellitus without complications: Secondary | ICD-10-CM | POA: Diagnosis not present

## 2023-01-16 DIAGNOSIS — E119 Type 2 diabetes mellitus without complications: Secondary | ICD-10-CM | POA: Diagnosis not present

## 2023-01-16 DIAGNOSIS — I10 Essential (primary) hypertension: Secondary | ICD-10-CM | POA: Diagnosis not present

## 2023-01-18 DIAGNOSIS — H52223 Regular astigmatism, bilateral: Secondary | ICD-10-CM | POA: Diagnosis not present

## 2023-01-18 DIAGNOSIS — H5203 Hypermetropia, bilateral: Secondary | ICD-10-CM | POA: Diagnosis not present

## 2023-01-18 DIAGNOSIS — E119 Type 2 diabetes mellitus without complications: Secondary | ICD-10-CM | POA: Diagnosis not present

## 2023-01-18 DIAGNOSIS — H524 Presbyopia: Secondary | ICD-10-CM | POA: Diagnosis not present

## 2023-01-23 DIAGNOSIS — K317 Polyp of stomach and duodenum: Secondary | ICD-10-CM | POA: Diagnosis not present

## 2023-01-23 DIAGNOSIS — K219 Gastro-esophageal reflux disease without esophagitis: Secondary | ICD-10-CM | POA: Diagnosis not present

## 2023-01-23 DIAGNOSIS — K8689 Other specified diseases of pancreas: Secondary | ICD-10-CM | POA: Diagnosis not present

## 2023-01-23 DIAGNOSIS — Z8719 Personal history of other diseases of the digestive system: Secondary | ICD-10-CM | POA: Diagnosis not present

## 2023-01-23 DIAGNOSIS — K3184 Gastroparesis: Secondary | ICD-10-CM | POA: Diagnosis not present

## 2023-02-15 ENCOUNTER — Ambulatory Visit: Admitting: Dietician

## 2023-02-20 DIAGNOSIS — I10 Essential (primary) hypertension: Secondary | ICD-10-CM | POA: Diagnosis not present

## 2023-02-20 DIAGNOSIS — E119 Type 2 diabetes mellitus without complications: Secondary | ICD-10-CM | POA: Diagnosis not present

## 2023-02-27 DIAGNOSIS — I10 Essential (primary) hypertension: Secondary | ICD-10-CM | POA: Diagnosis not present

## 2023-02-27 DIAGNOSIS — E119 Type 2 diabetes mellitus without complications: Secondary | ICD-10-CM | POA: Diagnosis not present

## 2023-02-28 DIAGNOSIS — E119 Type 2 diabetes mellitus without complications: Secondary | ICD-10-CM | POA: Diagnosis not present

## 2023-02-28 DIAGNOSIS — I10 Essential (primary) hypertension: Secondary | ICD-10-CM | POA: Diagnosis not present

## 2023-03-23 DIAGNOSIS — I10 Essential (primary) hypertension: Secondary | ICD-10-CM | POA: Diagnosis not present

## 2023-03-23 DIAGNOSIS — E119 Type 2 diabetes mellitus without complications: Secondary | ICD-10-CM | POA: Diagnosis not present

## 2023-04-22 DIAGNOSIS — I10 Essential (primary) hypertension: Secondary | ICD-10-CM | POA: Diagnosis not present

## 2023-04-22 DIAGNOSIS — E119 Type 2 diabetes mellitus without complications: Secondary | ICD-10-CM | POA: Diagnosis not present

## 2023-05-23 DIAGNOSIS — E119 Type 2 diabetes mellitus without complications: Secondary | ICD-10-CM | POA: Diagnosis not present

## 2023-05-23 DIAGNOSIS — I10 Essential (primary) hypertension: Secondary | ICD-10-CM | POA: Diagnosis not present

## 2023-06-12 DIAGNOSIS — K317 Polyp of stomach and duodenum: Secondary | ICD-10-CM | POA: Diagnosis not present

## 2023-06-12 DIAGNOSIS — J392 Other diseases of pharynx: Secondary | ICD-10-CM | POA: Diagnosis not present

## 2023-06-12 DIAGNOSIS — K2930 Chronic superficial gastritis without bleeding: Secondary | ICD-10-CM | POA: Diagnosis not present

## 2023-06-12 DIAGNOSIS — K449 Diaphragmatic hernia without obstruction or gangrene: Secondary | ICD-10-CM | POA: Diagnosis not present

## 2023-06-19 DIAGNOSIS — K317 Polyp of stomach and duodenum: Secondary | ICD-10-CM | POA: Diagnosis not present

## 2023-06-23 DIAGNOSIS — I10 Essential (primary) hypertension: Secondary | ICD-10-CM | POA: Diagnosis not present

## 2023-06-23 DIAGNOSIS — E119 Type 2 diabetes mellitus without complications: Secondary | ICD-10-CM | POA: Diagnosis not present

## 2023-06-28 DIAGNOSIS — I10 Essential (primary) hypertension: Secondary | ICD-10-CM | POA: Diagnosis not present

## 2023-06-28 DIAGNOSIS — E119 Type 2 diabetes mellitus without complications: Secondary | ICD-10-CM | POA: Diagnosis not present

## 2023-07-03 DIAGNOSIS — Z1231 Encounter for screening mammogram for malignant neoplasm of breast: Secondary | ICD-10-CM | POA: Diagnosis not present

## 2023-07-18 DIAGNOSIS — K219 Gastro-esophageal reflux disease without esophagitis: Secondary | ICD-10-CM | POA: Diagnosis not present

## 2023-08-16 DIAGNOSIS — N399 Disorder of urinary system, unspecified: Secondary | ICD-10-CM | POA: Diagnosis not present

## 2023-08-16 DIAGNOSIS — D7289 Other specified disorders of white blood cells: Secondary | ICD-10-CM | POA: Diagnosis not present

## 2023-08-16 DIAGNOSIS — Z5181 Encounter for therapeutic drug level monitoring: Secondary | ICD-10-CM | POA: Diagnosis not present

## 2023-08-16 DIAGNOSIS — Z794 Long term (current) use of insulin: Secondary | ICD-10-CM | POA: Diagnosis not present

## 2023-08-16 DIAGNOSIS — M25519 Pain in unspecified shoulder: Secondary | ICD-10-CM | POA: Diagnosis not present

## 2023-08-16 DIAGNOSIS — E7800 Pure hypercholesterolemia, unspecified: Secondary | ICD-10-CM | POA: Diagnosis not present

## 2023-08-16 DIAGNOSIS — I10 Essential (primary) hypertension: Secondary | ICD-10-CM | POA: Diagnosis not present

## 2023-08-16 DIAGNOSIS — I5189 Other ill-defined heart diseases: Secondary | ICD-10-CM | POA: Diagnosis not present

## 2023-08-16 DIAGNOSIS — E1169 Type 2 diabetes mellitus with other specified complication: Secondary | ICD-10-CM | POA: Diagnosis not present

## 2023-08-16 DIAGNOSIS — I700 Atherosclerosis of aorta: Secondary | ICD-10-CM | POA: Diagnosis not present

## 2023-08-16 DIAGNOSIS — E1165 Type 2 diabetes mellitus with hyperglycemia: Secondary | ICD-10-CM | POA: Diagnosis not present

## 2023-08-16 DIAGNOSIS — Z0000 Encounter for general adult medical examination without abnormal findings: Secondary | ICD-10-CM | POA: Diagnosis not present

## 2023-08-17 ENCOUNTER — Other Ambulatory Visit: Payer: Self-pay | Admitting: Internal Medicine

## 2023-08-17 ENCOUNTER — Ambulatory Visit
Admission: RE | Admit: 2023-08-17 | Discharge: 2023-08-17 | Disposition: A | Discharge status: Home / Self Care | Source: Ambulatory Visit | Attending: Internal Medicine | Admitting: Internal Medicine

## 2023-08-17 DIAGNOSIS — M19012 Primary osteoarthritis, left shoulder: Secondary | ICD-10-CM | POA: Diagnosis not present

## 2023-08-17 DIAGNOSIS — M25512 Pain in left shoulder: Secondary | ICD-10-CM

## 2023-08-21 DIAGNOSIS — M7542 Impingement syndrome of left shoulder: Secondary | ICD-10-CM | POA: Diagnosis not present

## 2023-08-26 DIAGNOSIS — M7542 Impingement syndrome of left shoulder: Secondary | ICD-10-CM | POA: Diagnosis not present

## 2023-10-30 DIAGNOSIS — E1169 Type 2 diabetes mellitus with other specified complication: Secondary | ICD-10-CM | POA: Diagnosis not present

## 2023-10-30 DIAGNOSIS — R531 Weakness: Secondary | ICD-10-CM | POA: Diagnosis not present

## 2023-10-30 DIAGNOSIS — W19XXXA Unspecified fall, initial encounter: Secondary | ICD-10-CM | POA: Diagnosis not present

## 2023-11-23 ENCOUNTER — Encounter: Attending: Internal Medicine | Admitting: Dietician

## 2023-11-23 DIAGNOSIS — E119 Type 2 diabetes mellitus without complications: Secondary | ICD-10-CM | POA: Diagnosis not present

## 2023-11-23 NOTE — Patient Instructions (Signed)
Pt would like to aim for 30-45 grams of carbohydrate per meal

## 2023-11-23 NOTE — Progress Notes (Signed)
Diabetes Self-Management Education  Visit Type: First/Initial  Appt. Start Time: 0921 Appt. End Time: 1032  11/23/2023  [name], identified by name and date of birth, is a 78 y.o. female with a diagnosis of Diabetes: Type 2.   ASSESSMENT  Patient is here today alone. Patient would like to learn about weight management. Pt reports mounjaro is not effective for her to promote weight loss. Pt reports she takes Creon TID. Pt states her last A1C was 7%. Pt reports recent blood usgar ranging 120-140 mg/dL. Pt states she is fearful to consume carbohydrate stating "it will make me gain" body weight. All Pt's questions were answered during this encounter. Pt states acid reflux is ongoing with symptoms of burning in her throat and stomach. Pt reports she takes a medication for acid reflux and cannot recall the name of this medication. Pt was able to accurate determine the amount of carbohydrate using a nutrition labels during today encounter.  History includes:   Past Medical History:  Diagnosis Date   Anemia    Anxiety    Arthritis    Complication of anesthesia years ago   jittery and nervous after 1 d and c   Diabetes mellitus    type 2   Esophageal stricture    GERD (gastroesophageal reflux disease)    Heart palpitations    heart palpitaions last 2 weeks, family md  dr polite aware no ekg done   Hypertension    Sleep apnea    does not tolerate cpap   UTI (urinary tract infection)     Labs noted:  No results found for: "HGBA1C"  No results found for: "CHOL", "HDL", "LDLCALC", "LDLDIRECT", "TRIG", "CHOLHDL" Last vitamin D No results found for: "25OHVITD2", "25OHVITD3", "VD25OH"   Medications include:   Current Outpatient Medications:    baclofen 5 MG TABS, Take 5 mg by mouth 3 (three) times daily as needed for muscle spasms., Disp: 30 tablet, Rfl: 0   benzonatate (TESSALON) 100 MG capsule, Take 1 capsule (100 mg total) by mouth every 8 (eight) hours., Disp: 21 capsule, Rfl:  0   ergocalciferol (VITAMIN D2) 1.25 MG (50000 UT) capsule, Take 50,000 Units by mouth once a week., Disp: , Rfl:    FLUoxetine (PROZAC) 40 MG capsule, Take 40 mg by mouth every morning. , Disp: , Rfl:    fluticasone (FLONASE) 50 MCG/ACT nasal spray, Place 1 spray into both nostrils daily., Disp: 16 g, Rfl: 0   insulin glargine (LANTUS) 100 UNIT/ML injection, Inject 40 Units into the skin every morning. , Disp: , Rfl:    lidocaine (XYLOCAINE) 2 % solution, Use as directed 10 mLs in the mouth or throat as needed for mouth pain., Disp: 100 mL, Rfl: 0   losartan (COZAAR) 25 MG tablet, , Disp: , Rfl:    metoprolol (LOPRESSOR) 50 MG tablet, Take 50 mg by mouth every morning. , Disp: , Rfl:    Pancrelipase, Lip-Prot-Amyl, (CREON PO), Take by mouth. TID, Disp: , Rfl:    potassium chloride SA (KLOR-CON) 20 MEQ tablet, Take 1 tablet (20 mEq total) by mouth 2 (two) times daily., Disp: 20 tablet, Rfl: 0   tirzepatide (MOUNJARO) 7.5 MG/0.5ML Pen, Inject 7.5 mg into the skin once a week., Disp: , Rfl:    ACCU-CHEK SMARTVIEW test strip, , Disp: , Rfl:    AIMSCO INSULIN SYR ULTRA THIN 31G X 5/16" 0.3 ML MISC, , Disp: , Rfl:    Blood Glucose Monitoring Suppl (ACCU-CHEK NANO SMARTVIEW) W/DEVICE KIT, ,  Disp: , Rfl:    Lancet Devices (SIMPLE DIAGNOSTICS LANCING DEV) MISC, , Disp: , Rfl:    omeprazole (PRILOSEC) 20 MG capsule, , Disp: , Rfl:    silver sulfADIAZINE (SILVADENE) 1 % cream, Apply 1 application topically daily., Disp: 50 g, Rfl: 0   sitaGLIPtin (JANUVIA) 100 MG tablet, Take 100 mg by mouth every morning. (Patient not taking: Reported on 11/23/2023), Disp: , Rfl:    There were no vitals taken for this visit. There is no height or weight on file to calculate BMI.   Diabetes Self-Management Education - 11/23/23 0938       Visit Information   Visit Type First/Initial      Initial Visit   Diabetes Type Type 2    Date Diagnosed 20 years ago    Are you currently following a meal plan? No    Are you  taking your medications as prescribed? Yes      Health Coping   How would you rate your overall health? Fair      Psychosocial Assessment   Patient Belief/Attitude about Diabetes Motivated to manage diabetes    What is the hardest part about your diabetes right now, causing you the most concern, or is the most worrisome to you about your diabetes?   Other (comment)   weight management   Self-care barriers None    Self-management support Doctor's office    Other persons present Patient    Patient Concerns Weight Control;Nutrition/Meal planning    Special Needs None    Preferred Learning Style Visual;Hands on;Auditory    Learning Readiness Change in progress    How often do you need to have someone help you when you read instructions, pamphlets, or other written materials from your doctor or pharmacy? 1 - Never    What is the last grade level you completed in school? 12th      Pre-Education Assessment   Patient understands the diabetes disease and treatment process. Needs Review    Patient understands incorporating nutritional management into lifestyle. Needs Review    Patient undertands incorporating physical activity into lifestyle. Needs Review    Patient understands using medications safely. Comprehends key points    Patient understands monitoring blood glucose, interpreting and using results Comprehends key points    Patient understands prevention, detection, and treatment of acute complications. Comprehends key points    Patient understands prevention, detection, and treatment of chronic complications. Compreheands key points    Patient understands how to develop strategies to address psychosocial issues. Comprehends key points    Patient understands how to develop strategies to promote health/change behavior. Needs Review      Complications   How often do you check your blood sugar? 1-2 times/day    Fasting Blood glucose range (mg/dL) 16-109;604-540    Have you had a dilated eye  exam in the past 12 months? Yes    Have you had a dental exam in the past 12 months? Yes    Are you checking your feet? Yes    How many days per week are you checking your feet? 7      Dietary Intake   Breakfast boiled egg, bacon or grits, bacon, eggs, rise coffee with splenda and splash of milk    Lunch skips    Snack (afternoon) cheese, 2-4 crackers    Dinner rice, vegetables, egg roll,  drummete or meatloaf, mixed green, potato salad    Beverage(s) water, rise coffee with splenda and splash of milk  Activity / Exercise   Activity / Exercise Type Light (walking / raking leaves)    How many days per week do you exercise? 2    How many minutes per day do you exercise? 45    Total minutes per week of exercise 90      Patient Education   Disease Pathophysiology Definition of diabetes, type 1 and 2, and the diagnosis of diabetes    Healthy Eating Role of diet in the treatment of diabetes and the relationship between the three main macronutrients and blood glucose level;Plate Method    Being Active Role of exercise on diabetes management, blood pressure control and cardiac health.    Medications Reviewed patients medication for diabetes, action, purpose, timing of dose and side effects.    Monitoring Identified appropriate SMBG and/or A1C goals.    Chronic complications Relationship between chronic complications and blood glucose control    Diabetes Stress and Support Worked with patient to identify barriers to care and solutions    Lifestyle and Health Coping Lifestyle issues that need to be addressed for better diabetes care      Individualized Goals (developed by patient)   Nutrition Follow meal plan discussed    Physical Activity 30 minutes per day;Exercise 3-5 times per week    Medications take my medication as prescribed    Monitoring  Test my blood glucose as discussed    Problem Solving Addressing barriers to behavior change    Reducing Risk do foot checks daily    Health  Coping Ask for help with psychological, social, or emotional issues      Post-Education Assessment   Patient understands the diabetes disease and treatment process. Needs Review    Patient understands incorporating nutritional management into lifestyle. Needs Review    Patient undertands incorporating physical activity into lifestyle. Needs Review    Patient understands using medications safely. Needs Review    Patient understands monitoring blood glucose, interpreting and using results Needs Review    Patient understands prevention, detection, and treatment of acute complications. Needs Review    Patient understands prevention, detection, and treatment of chronic complications. Needs Review    Patient understands how to develop strategies to address psychosocial issues. Needs Review    Patient understands how to develop strategies to promote health/change behavior. Needs Review      Outcomes   Expected Outcomes Demonstrated interest in learning but significant barriers to change    Future DMSE 3-4 months    Program Status Not Completed             Individualized Plan for Diabetes Self-Management Training:   Learning Objective:  Patient will have a greater understanding of diabetes self-management. Patient education plan is to attend individual and/or group sessions per assessed needs and concerns.   Plan:   Patient Instructions  Pt would like to aim for 30-45 grams of carbohydrate per meal   Expected Outcomes:  Demonstrated interest in learning but significant barriers to change  Education material provided: ADA - How to Thrive: A Guide for Your Journey with Diabetes, My Plate, and Snack sheet  If problems or questions, patient to contact team via:  Phone  Future DSME appointment: 3-4 months

## 2023-12-28 ENCOUNTER — Ambulatory Visit: Admitting: Dietician

## 2024-01-16 ENCOUNTER — Ambulatory Visit: Admitting: Dietician

## 2024-01-21 DIAGNOSIS — E119 Type 2 diabetes mellitus without complications: Secondary | ICD-10-CM | POA: Diagnosis not present

## 2024-01-21 DIAGNOSIS — H43812 Vitreous degeneration, left eye: Secondary | ICD-10-CM | POA: Diagnosis not present

## 2024-01-21 DIAGNOSIS — H35033 Hypertensive retinopathy, bilateral: Secondary | ICD-10-CM | POA: Diagnosis not present

## 2024-01-21 DIAGNOSIS — H25813 Combined forms of age-related cataract, bilateral: Secondary | ICD-10-CM | POA: Diagnosis not present

## 2024-02-11 ENCOUNTER — Ambulatory Visit (INDEPENDENT_AMBULATORY_CARE_PROVIDER_SITE_OTHER)

## 2024-02-11 ENCOUNTER — Ambulatory Visit (HOSPITAL_COMMUNITY)
Admission: EM | Admit: 2024-02-11 | Discharge: 2024-02-11 | Disposition: A | Discharge status: Home / Self Care | Attending: Family Medicine | Admitting: Family Medicine

## 2024-02-11 ENCOUNTER — Encounter (HOSPITAL_COMMUNITY): Payer: Self-pay | Admitting: Emergency Medicine

## 2024-02-11 DIAGNOSIS — R079 Chest pain, unspecified: Secondary | ICD-10-CM | POA: Diagnosis not present

## 2024-02-11 DIAGNOSIS — W19XXXA Unspecified fall, initial encounter: Secondary | ICD-10-CM

## 2024-02-11 DIAGNOSIS — J9811 Atelectasis: Secondary | ICD-10-CM | POA: Diagnosis not present

## 2024-02-11 MED ORDER — KETOROLAC TROMETHAMINE 30 MG/ML IJ SOLN
INTRAMUSCULAR | Status: AC
  Filled 2024-02-11: qty 1

## 2024-02-11 MED ORDER — MELOXICAM 15 MG PO TABS: ORAL_TABLET | ORAL | 0 refills | Status: AC

## 2024-02-11 MED ORDER — KETOROLAC TROMETHAMINE 30 MG/ML IJ SOLN
30.0000 mg | Freq: Once | INTRAMUSCULAR | Status: AC
  Administered 2024-02-11: 30 mg via INTRAMUSCULAR

## 2024-02-11 NOTE — ED Triage Notes (Signed)
 Pt fell last Wed. Her left breast hit chair on way down. Pt c/o chest, back, shoulder pains. Took Ibuprofen 800 mg yesterday.

## 2024-02-11 NOTE — Discharge Instructions (Addendum)
 Please take your meloxicam  15 mg starting tomorrow.  Please take 1 pill a day with food.  Please do not take any other ibuprofen, naproxen or NSAIDs while you take this medication.  If this medication is giving you an upset stomach that you cannot tolerate them please stop taking this medication.  Please follow-up with us  if you feel like you are not having any improvement despite taking these medications.

## 2024-02-11 NOTE — ED Provider Notes (Signed)
 [facility]    CSN: [id number] Arrival date & time: 02/11/24  8295      History   Chief Complaint Chief Complaint  Patient presents with   Fall    HPI [name] is a 78 y.o. female.   Patient is here for some breast pain that has been occurring since a fall last week.  Patient states that she fell last week and as she was falling down she could not or stop herself.  Patient notes that her left breast hit a chair on the way down.  Patient states that since then she has some chest pain with movements that is mainly over her breast area as well as her sternum.  Patient denies any shortness of breath but does note that there is some pain with deep breaths.  Patient states that the pain also feels like it is going to her back in the same area on the posterior aspect of the thoracic spine.  Patient has some paraspinal pain as well as some mild tenderness of his spine process itself.  Patient denies any shortness of breath or any other concerns at this time.   Fall    Past Medical History:  Diagnosis Date   Anemia    Anxiety    Arthritis    Complication of anesthesia years ago   jittery and nervous after 1 d and c   Diabetes mellitus    type 2   Esophageal stricture    GERD (gastroesophageal reflux disease)    Heart palpitations    heart palpitaions last 2 weeks, family md  dr polite aware no ekg done   Hypertension    Sleep apnea    does not tolerate cpap   UTI (urinary tract infection)     Patient Active Problem List   Diagnosis Date Noted   Gastroesophageal reflux disease 07/18/2023   Left leg pain 08/03/2015   Left knee pain 05/19/2015    Past Surgical History:  Procedure Laterality Date   CESAREAN SECTION     x 1   CHOLECYSTECTOMY     COLONOSCOPY WITH PROPOFOL  N/A 09/14/2014   Procedure: COLONOSCOPY WITH PROPOFOL ;  Surgeon: Garrett Kallman, MD;  Location: [facility];  Service: Endoscopy;  Laterality: N/A;   DILATION AND CURETTAGE OF UTERUS      2 or 3 times   ESOPHAGOGASTRODUODENOSCOPY     said she had one many years ago but can't remember the year   surgery for leg fracture Right    TUBAL LIGATION      OB History   No obstetric history on file.      Home Medications    Prior to Admission medications   Medication Sig Start Date End Date Taking? Authorizing Provider  meloxicam  (MOBIC ) 15 MG tablet Take 1 tablet daily with food for 12 days. Then take as needed. 02/11/24  Yes Jude Norton, MD  ACCU-CHEK SMARTVIEW test strip  04/19/15   [provider]  AIMSCO INSULIN SYR ULTRA THIN 31G X 5/16" 0.3 ML MISC  04/19/15   [provider]  B-D ULTRAFINE III SHORT PEN 31G X 8 MM MISC Inject into the skin daily. 10/05/23   [provider]  baclofen  5 MG TABS Take 5 mg by mouth 3 (three) times daily as needed for muscle spasms. 06/12/22   Dodson Freestone, FNP  benzonatate  (TESSALON ) 100 MG capsule Take 1 capsule (100 mg total) by mouth every 8 (eight) hours. 10/15/22   Raspet,  Betsey Brow, PA-C  Blood Glucose Monitoring Suppl (ACCU-CHEK NANO SMARTVIEW) W/DEVICE KIT  04/19/15   [provider]  cyclobenzaprine (FLEXERIL) 5 MG tablet Take 5 mg by mouth. 08/26/23   [provider]  cyclobenzaprine (FLEXERIL) 5 MG tablet Take 5 mg by mouth at bedtime as needed. 08/30/23   [provider]  ergocalciferol (VITAMIN D2) 1.25 MG (50000 UT) capsule Take 50,000 Units by mouth once a week.    [provider]  FLUoxetine (PROZAC) 40 MG capsule Take 40 mg by mouth every morning.     [provider]  fluticasone  (FLONASE ) 50 MCG/ACT nasal spray Place 1 spray into both nostrils daily. 10/15/22   Raspet, Erin K, PA-C  furosemide (LASIX) 20 MG tablet Take 20 mg by mouth daily.    [provider]  insulin glargine (LANTUS) 100 UNIT/ML injection Inject 40 Units into the skin every morning.     [provider]  Lancet Devices (SIMPLE DIAGNOSTICS LANCING DEV) MISC  04/19/15    [provider]  lidocaine  (XYLOCAINE ) 2 % solution Use as directed 10 mLs in the mouth or throat as needed for mouth pain. 05/11/21   Corbin Dess, PA-C  losartan (COZAAR) 25 MG tablet  06/26/18   [provider]  metoprolol (LOPRESSOR) 50 MG tablet Take 50 mg by mouth every morning.     [provider]  naproxen (NAPROSYN) 500 MG tablet Take 500 mg by mouth 2 (two) times daily with a meal. 08/30/23   [provider]  omeprazole (PRILOSEC) 20 MG capsule  05/14/15   [provider]  Pancrelipase, Lip-Prot-Amyl, (CREON PO) Take by mouth. TID    [provider]  potassium chloride  SA (KLOR-CON ) 20 MEQ tablet Take 1 tablet (20 mEq total) by mouth 2 (two) times daily. 02/14/20   Carlton Chick, MD  silver  sulfADIAZINE  (SILVADENE ) 1 % cream Apply 1 application topically daily. 12/02/21   Dodson Freestone, FNP  sitaGLIPtin (JANUVIA) 100 MG tablet Take 100 mg by mouth every morning. Patient not taking: Reported on 11/23/2023    [provider]  tirzepatide [facility]) 7.5 MG/0.5ML Pen Inject 7.5 mg into the skin once a week.    [provider]    Family History Family History  Problem Relation Age of Onset   Diabetes Mother    Ovarian cancer Mother    Colon cancer Father    Diabetes Father    Diabetes Sister    Diabetes Sister    Diabetes Sister    Diabetes Sister    Esophageal cancer Neg Hx    Breast cancer Neg Hx     Social History Social History   Tobacco Use   Smoking status: Never   Smokeless tobacco: Never  Vaping Use   Vaping status: Never Used  Substance Use Topics   Alcohol use: Yes    Alcohol/week: 0.0 standard drinks of alcohol    Comment: occasional wine   Drug use: No     Allergies   Patient has no known allergies.   Review of Systems Review of Systems   Physical Exam Triage Vital Signs ED Triage Vitals  Encounter Vitals Group     BP 02/11/24 1030 (!) 151/92     Systolic BP  Percentile --      Diastolic BP Percentile --      Pulse Rate 02/11/24 1030 80     Resp 02/11/24 1030 20     Temp 02/11/24 1030 98.1 F (36.7 C)  Temp Source 02/11/24 1030 Oral     SpO2 02/11/24 1030 97 %     Weight --      Height --      Head Circumference --      Peak Flow --      Pain Score 02/11/24 1029 8     Pain Loc --      Pain Education --      Exclude from Growth Chart --    No data found.  Updated Vital Signs BP (!) 151/92 (BP Location: Right Arm)   Pulse 80   Temp 98.1 F (36.7 C) (Oral)   Resp 20   SpO2 97%   Visual Acuity Right Eye Distance:   Left Eye Distance:   Bilateral Distance:    Right Eye Near:   Left Eye Near:    Bilateral Near:     Physical Exam Inspection reveals no gross abnormalities of the spine or breastbone.  There is tenderness to palpation over the left breast as well as the sternum with most of the pain near the zygomatic process.  Patient has more pain on the left side than the right though there is some minimal pain on the right noted as well.  Patient did have some tenderness over the thoracic spine.  There is tenderness over the paraspinals as well as spinous process.   UC Treatments / Results  Labs (all labs ordered are listed, but only abnormal results are displayed) Labs Reviewed - No data to display  EKG   Radiology No results found.  Procedures Procedures (including critical care time)  Medications Ordered in UC Medications  ketorolac  (TORADOL ) 30 MG/ML injection 30 mg (30 mg Intramuscular Given 02/11/24 1136)    Initial Impression / Assessment and Plan / UC Course  I have reviewed the triage vital signs and the nursing notes.  Pertinent labs & imaging results that were available during my care of the patient were reviewed by me and considered in my medical decision making (see chart for details).     Patient underwent fall and direct blow to the breast area as well as breastbone.  Chest x-rays do not show any  acute abnormality at this time.  I do suspect patient is likely dealing with costochondritis type pain.  Patient was given a Toradol  injection in clinic today and noted improvement.  Patient has a history of pancreatic enzyme deficiency as well as upset stomach, given these findings we will avoid doing high doses of naproxen multiple times a day and instead will go ahead and do meloxicam  for the next 12 days as well.  Patient was advised that this pain may last anywhere between 4 to 6 weeks depending on the recovery.  Patient advised to follow-up if no improvement.  Patient understanding and agreeable with plan. Final Clinical Impressions(s) / UC Diagnoses   Final diagnoses:  Fall, initial encounter     Discharge Instructions      Please take your meloxicam  15 mg starting tomorrow.  Please take 1 pill a day with food.  Please do not take any other ibuprofen, naproxen or NSAIDs while you take this medication.  If this medication is giving you an upset stomach that you cannot tolerate them please stop taking this medication.  Please follow-up with us  if you feel like you are not having any improvement despite taking these medications.     ED Prescriptions     Medication Sig Dispense Auth. Provider   meloxicam  (MOBIC ) 15 MG  tablet Take 1 tablet daily with food for 12 days. Then take as needed. 12 tablet Jannifer Fischler, MD      PDMP not reviewed this encounter.   Jude Norton, MD 02/11/24 [phone]

## 2024-02-15 DIAGNOSIS — I700 Atherosclerosis of aorta: Secondary | ICD-10-CM | POA: Diagnosis not present

## 2024-02-15 DIAGNOSIS — E1165 Type 2 diabetes mellitus with hyperglycemia: Secondary | ICD-10-CM | POA: Diagnosis not present

## 2024-02-15 DIAGNOSIS — I10 Essential (primary) hypertension: Secondary | ICD-10-CM | POA: Diagnosis not present

## 2024-02-15 DIAGNOSIS — K8689 Other specified diseases of pancreas: Secondary | ICD-10-CM | POA: Diagnosis not present

## 2024-02-15 DIAGNOSIS — Z794 Long term (current) use of insulin: Secondary | ICD-10-CM | POA: Diagnosis not present

## 2024-02-15 DIAGNOSIS — G4733 Obstructive sleep apnea (adult) (pediatric): Secondary | ICD-10-CM | POA: Diagnosis not present

## 2024-02-15 DIAGNOSIS — E7800 Pure hypercholesterolemia, unspecified: Secondary | ICD-10-CM | POA: Diagnosis not present

## 2024-02-15 DIAGNOSIS — E559 Vitamin D deficiency, unspecified: Secondary | ICD-10-CM | POA: Diagnosis not present

## 2024-03-31 DIAGNOSIS — E1169 Type 2 diabetes mellitus with other specified complication: Secondary | ICD-10-CM | POA: Diagnosis not present

## 2024-03-31 DIAGNOSIS — M791 Myalgia, unspecified site: Secondary | ICD-10-CM | POA: Diagnosis not present

## 2024-03-31 DIAGNOSIS — M2550 Pain in unspecified joint: Secondary | ICD-10-CM | POA: Diagnosis not present

## 2024-04-08 DIAGNOSIS — E1165 Type 2 diabetes mellitus with hyperglycemia: Secondary | ICD-10-CM | POA: Diagnosis not present

## 2024-04-26 ENCOUNTER — Ambulatory Visit
Admission: EM | Admit: 2024-04-26 | Discharge: 2024-04-26 | Disposition: A | Discharge status: Home / Self Care | Attending: Family Medicine | Admitting: Family Medicine

## 2024-04-26 DIAGNOSIS — J4521 Mild intermittent asthma with (acute) exacerbation: Secondary | ICD-10-CM

## 2024-04-26 DIAGNOSIS — G4733 Obstructive sleep apnea (adult) (pediatric): Secondary | ICD-10-CM | POA: Insufficient documentation

## 2024-04-26 DIAGNOSIS — M25562 Pain in left knee: Secondary | ICD-10-CM | POA: Insufficient documentation

## 2024-04-26 DIAGNOSIS — K5900 Constipation, unspecified: Secondary | ICD-10-CM | POA: Insufficient documentation

## 2024-04-26 DIAGNOSIS — E559 Vitamin D deficiency, unspecified: Secondary | ICD-10-CM | POA: Insufficient documentation

## 2024-04-26 DIAGNOSIS — K760 Fatty (change of) liver, not elsewhere classified: Secondary | ICD-10-CM | POA: Insufficient documentation

## 2024-04-26 DIAGNOSIS — J019 Acute sinusitis, unspecified: Secondary | ICD-10-CM | POA: Diagnosis not present

## 2024-04-26 DIAGNOSIS — I10 Essential (primary) hypertension: Secondary | ICD-10-CM | POA: Insufficient documentation

## 2024-04-26 DIAGNOSIS — Z794 Long term (current) use of insulin: Secondary | ICD-10-CM | POA: Insufficient documentation

## 2024-04-26 DIAGNOSIS — B0229 Other postherpetic nervous system involvement: Secondary | ICD-10-CM | POA: Insufficient documentation

## 2024-04-26 DIAGNOSIS — I5189 Other ill-defined heart diseases: Secondary | ICD-10-CM | POA: Insufficient documentation

## 2024-04-26 DIAGNOSIS — R1010 Upper abdominal pain, unspecified: Secondary | ICD-10-CM | POA: Insufficient documentation

## 2024-04-26 DIAGNOSIS — E1165 Type 2 diabetes mellitus with hyperglycemia: Secondary | ICD-10-CM | POA: Insufficient documentation

## 2024-04-26 DIAGNOSIS — R110 Nausea: Secondary | ICD-10-CM | POA: Insufficient documentation

## 2024-04-26 DIAGNOSIS — R198 Other specified symptoms and signs involving the digestive system and abdomen: Secondary | ICD-10-CM | POA: Insufficient documentation

## 2024-04-26 DIAGNOSIS — M5430 Sciatica, unspecified side: Secondary | ICD-10-CM | POA: Insufficient documentation

## 2024-04-26 DIAGNOSIS — F418 Other specified anxiety disorders: Secondary | ICD-10-CM | POA: Insufficient documentation

## 2024-04-26 DIAGNOSIS — I700 Atherosclerosis of aorta: Secondary | ICD-10-CM | POA: Insufficient documentation

## 2024-04-26 DIAGNOSIS — F325 Major depressive disorder, single episode, in full remission: Secondary | ICD-10-CM | POA: Insufficient documentation

## 2024-04-26 DIAGNOSIS — N390 Urinary tract infection, site not specified: Secondary | ICD-10-CM | POA: Insufficient documentation

## 2024-04-26 DIAGNOSIS — K8689 Other specified diseases of pancreas: Secondary | ICD-10-CM | POA: Insufficient documentation

## 2024-04-26 DIAGNOSIS — N951 Menopausal and female climacteric states: Secondary | ICD-10-CM | POA: Insufficient documentation

## 2024-04-26 DIAGNOSIS — J309 Allergic rhinitis, unspecified: Secondary | ICD-10-CM | POA: Insufficient documentation

## 2024-04-26 DIAGNOSIS — K5730 Diverticulosis of large intestine without perforation or abscess without bleeding: Secondary | ICD-10-CM | POA: Insufficient documentation

## 2024-04-26 DIAGNOSIS — E7800 Pure hypercholesterolemia, unspecified: Secondary | ICD-10-CM | POA: Insufficient documentation

## 2024-04-26 DIAGNOSIS — E1169 Type 2 diabetes mellitus with other specified complication: Secondary | ICD-10-CM | POA: Insufficient documentation

## 2024-04-26 DIAGNOSIS — Z800 Family history of malignant neoplasm of digestive organs: Secondary | ICD-10-CM | POA: Insufficient documentation

## 2024-04-26 DIAGNOSIS — R195 Other fecal abnormalities: Secondary | ICD-10-CM | POA: Insufficient documentation

## 2024-04-26 MED ORDER — ALBUTEROL SULFATE HFA 108 (90 BASE) MCG/ACT IN AERS: 2.0000 | INHALATION_SPRAY | RESPIRATORY_TRACT | 0 refills | Status: AC | PRN

## 2024-04-26 MED ORDER — BENZONATATE 100 MG PO CAPS
100.0000 mg | ORAL_CAPSULE | Freq: Three times a day (TID) | ORAL | 0 refills | Status: AC | PRN
Start: 2024-04-26 — End: ?

## 2024-04-26 MED ORDER — DOXYCYCLINE HYCLATE 100 MG PO CAPS: 100.0000 mg | ORAL_CAPSULE | Freq: Two times a day (BID) | ORAL | 0 refills | Status: AC

## 2024-04-26 MED ORDER — PREDNISONE 20 MG PO TABS: 40.0000 mg | ORAL_TABLET | Freq: Every day | ORAL | 0 refills | Status: AC

## 2024-04-26 NOTE — ED Provider Notes (Signed)
 [name]    CSN: [id number] Arrival date & time: 04/26/24  0957      History   Chief Complaint Chief Complaint  Patient presents with   Sinus Problem    HPI [name] is a 78 y.o. female.    Sinus Problem  Here for nasal congestion and rhinorrhea, cough and chest congestion.  Symptoms began over a month ago.  She went out of town and they improved for a bit and then returned when she returned to home about 3 weeks ago.  No fever at any point.  Initially she did have some sore throat but that is resolved.  She has never known herself to have asthma.  She is allergic to metformin  Past medical history includes diabetes and she has not been checking her sugars.  Past Medical History:  Diagnosis Date   Anemia    Anxiety    Arthritis    Complication of anesthesia years ago   jittery and nervous after 1 d and c   Diabetes mellitus    type 2   Esophageal stricture    GERD (gastroesophageal reflux disease)    Heart palpitations    heart palpitaions last 2 weeks, family md  dr polite aware no ekg done   Hypertension    Sleep apnea    does not tolerate cpap   UTI (urinary tract infection)     Patient Active Problem List   Diagnosis Date Noted   Allergic rhinitis 04/26/2024   Constipation 04/26/2024   Diastolic dysfunction 04/26/2024   Diverticular disease of colon 04/26/2024   Essential hypertension 04/26/2024   Family history of malignant neoplasm of gastrointestinal tract 04/26/2024   Hardening of the aorta (main artery of the heart) (HCC) 04/26/2024   Irregular bowel habits 04/26/2024   Hyperglycemia due to type 2 diabetes mellitus (HCC) 04/26/2024   Long term current use of insulin (HCC) 04/26/2024   Loose stools 04/26/2024   Menopausal symptom 04/26/2024   Major depressive disorder, single episode, in full remission (HCC) 04/26/2024   Mixed anxiety and depressive disorder 04/26/2024   Morbid obesity (HCC) 04/26/2024    Obstructive sleep apnea syndrome 04/26/2024   Nausea 04/26/2024   Pancreatic insufficiency 04/26/2024   Postherpetic neuralgia 04/26/2024   Pure hypercholesterolemia 04/26/2024   Type 2 diabetes mellitus with other specified complication (HCC) 04/26/2024   Steatosis of liver 04/26/2024   Sciatica 04/26/2024   Upper abdominal pain 04/26/2024   Urinary tract infectious disease 04/26/2024   Arthralgia of left knee 04/26/2024   Vitamin D deficiency 04/26/2024   Gastroesophageal reflux disease 07/18/2023   Left leg pain 08/03/2015   Left knee pain 05/19/2015    Past Surgical History:  Procedure Laterality Date   CESAREAN SECTION     x 1   CHOLECYSTECTOMY     COLONOSCOPY WITH PROPOFOL  N/A 09/14/2014   Procedure: COLONOSCOPY WITH PROPOFOL ;  Surgeon: Gladis MARLA Louder, MD;  Location: [facility];  Service: Endoscopy;  Laterality: N/A;   DILATION AND CURETTAGE OF UTERUS     2 or 3 times   ESOPHAGOGASTRODUODENOSCOPY     said she had one many years ago but can't remember the year   surgery for leg fracture Right    TUBAL LIGATION      OB History   No obstetric history on file.      Home Medications    Prior to Admission medications   Medication Sig Start Date End Date Taking? Authorizing Provider  albuterol  (  VENTOLIN  HFA) 108 (90 Base) MCG/ACT inhaler Inhale 2 puffs into the lungs every 4 (four) hours as needed for wheezing or shortness of breath. 04/26/24  Yes Vonna Sharlet POUR, MD  benzonatate  (TESSALON ) 100 MG capsule Take 1 capsule (100 mg total) by mouth 3 (three) times daily as needed for cough. 04/26/24  Yes Vonna Sharlet POUR, MD  Continuous Glucose Receiver (FREESTYLE LIBRE 3 READER) DEVI use to monitor blood sugar daily as directed 03/31/24  Yes [provider]  Continuous Glucose Sensor (FREESTYLE LIBRE 3 SENSOR) MISC apply to upper arm every 14 days; Duration: 90 days 03/31/24  Yes [provider]  dextromethorphan-guaiFENesin (MUCINEX DM) 30-600 MG 12hr  tablet Take 1 tablet by mouth 2 (two) times daily.   Yes [provider]  doxycycline  (VIBRAMYCIN ) 100 MG capsule Take 1 capsule (100 mg total) by mouth 2 (two) times daily for 7 days. 04/26/24 05/03/24 Yes Taela Charbonneau K, MD  FLUoxetine (PROZAC) 40 MG capsule Take 40 mg by mouth daily. 04/21/19  Yes [provider]  FLUoxetine HCl 60 MG TABS Take 1 tablet by mouth daily. 12/30/23  Yes [provider]  fluticasone  (FLONASE ) 50 MCG/ACT nasal spray Place 1 spray into both nostrils daily. 10/15/22  Yes [provider]  LANTUS SOLOSTAR 100 UNIT/ML Solostar Pen Inject 40 Units into the skin daily. 01/11/24  Yes [provider]  losartan (COZAAR) 50 MG tablet Take 50 mg by mouth daily. 02/13/20  Yes [provider]  omeprazole (PRILOSEC) 20 MG capsule Take 20 mg by mouth daily. 04/21/19  Yes [provider]  predniSONE  (DELTASONE ) 20 MG tablet Take 2 tablets (40 mg total) by mouth daily with breakfast for 3 days. 04/26/24 04/29/24 Yes Vonna Sharlet POUR, MD  Probiotic Product (ALIGN) 10 MG CAPS Take 4 mg by mouth daily. 08/28/22  Yes [provider]  Semaglutide (RYBELSUS) 3 MG TABS Take 1 tablet by mouth daily. 03/31/20  Yes [provider]  tirzepatide CLOYDE) 5 MG/0.5ML Pen Inject 5 mg into the skin once a week. 07/15/23  Yes [provider]  Vitamin D, Ergocalciferol, (DRISDOL) 1.25 MG (50000 UNIT) CAPS capsule Take 1 capsule by mouth once a week. 03/31/20  Yes [provider]  ACCU-CHEK SMARTVIEW test strip  04/19/15   [provider]  Accu-Chek Softclix Lancets lancets USE 1 TO CHECK GLUCOSE TWICE DAILY; Duration: 90 days    [provider]  AIMSCO INSULIN SYR ULTRA THIN 31G X 5/16 0.3 ML MISC  04/19/15   [provider]  B-D ULTRAFINE III SHORT PEN 31G X 8 MM MISC Inject into the skin daily. 10/05/23   [provider]  baclofen  5 MG TABS Take 5 mg by mouth 3 (three) times daily  as needed for muscle spasms. 06/12/22   Hazen Darryle BRAVO, FNP  Blood Glucose Monitoring Suppl (ACCU-CHEK NANO SMARTVIEW) W/DEVICE KIT  04/19/15   [provider]  cholecalciferol (VITAMIN D3) 25 MCG (1000 UNIT) tablet Take 1,000 Units by mouth daily.    [provider]  CREON 36000-114000 units CPEP capsule Take 36,000 Units by mouth 3 (three) times daily before meals.    [provider]  cyclobenzaprine (FLEXERIL) 5 MG tablet Take 5 mg by mouth. 08/26/23   [provider]  cyclobenzaprine (FLEXERIL) 5 MG tablet Take 5 mg by mouth at bedtime as needed. 08/30/23   [provider]  ergocalciferol (VITAMIN D2) 1.25 MG (50000 UT) capsule Take 50,000 Units by mouth once a week.  [provider]  FLUoxetine (PROZAC) 40 MG capsule Take 40 mg by mouth every morning.     [provider]  fluticasone  (FLONASE ) 50 MCG/ACT nasal spray Place 1 spray into both nostrils daily. 10/15/22   Raspet, Erin K, PA-C  furosemide (LASIX) 20 MG tablet Take 20 mg by mouth daily.    [provider]  insulin glargine (LANTUS) 100 UNIT/ML injection Inject 40 Units into the skin every morning.     [provider]  Lancet Devices (SIMPLE DIAGNOSTICS LANCING DEV) MISC  04/19/15   [provider]  lidocaine  (XYLOCAINE ) 2 % solution Use as directed 10 mLs in the mouth or throat as needed for mouth pain. 05/11/21   Stuart Vernell Norris, PA-C  losartan (COZAAR) 100 MG tablet Take 100 mg by mouth daily.    [provider]  losartan (COZAAR) 25 MG tablet  06/26/18   [provider]  meloxicam  (MOBIC ) 15 MG tablet Take 1 tablet daily with food for 12 days. Then take as needed. 02/11/24   Jha, Panav, MD  metoprolol (LOPRESSOR) 50 MG tablet Take 50 mg by mouth every morning.     [provider]  naproxen (NAPROSYN) 500 MG tablet Take 500 mg by mouth 2 (two) times daily with a meal. 08/30/23   [provider]  omeprazole  (PRILOSEC) 20 MG capsule  05/14/15   [provider]  Pancrelipase, Lip-Prot-Amyl, (CREON PO) Take by mouth. TID    [provider]  pantoprazole (PROTONIX) 40 MG tablet Take 40 mg by mouth 2 (two) times daily.    [provider]  potassium chloride  SA (KLOR-CON ) 20 MEQ tablet Take 1 tablet (20 mEq total) by mouth 2 (two) times daily. 02/14/20   Lorriane Holmes, MD  Probiotic TBEC as directed Orally    [provider]  rosuvastatin (CRESTOR) 10 MG tablet Take 10 mg by mouth at bedtime.    [provider]  silver  sulfADIAZINE  (SILVADENE ) 1 % cream Apply 1 application topically daily. 12/02/21   Hazen Darryle BRAVO, FNP  sitaGLIPtin (JANUVIA) 100 MG tablet Take 100 mg by mouth every morning. Patient not taking: Reported on 11/23/2023    [provider]  tirzepatide [facility]) 7.5 MG/0.5ML Pen Inject 7.5 mg into the skin once a week.    [provider]  Turmeric 500 MG CAPS as directed Orally    [provider]    Family History Family History  Problem Relation Age of Onset   Diabetes Mother    Ovarian cancer Mother    Colon cancer Father    Diabetes Father    Diabetes Sister    Diabetes Sister    Diabetes Sister    Diabetes Sister    Esophageal cancer Neg Hx    Breast cancer Neg Hx     Social History Social History   Tobacco Use   Smoking status: Never   Smokeless tobacco: Never  Vaping Use   Vaping status: Never Used  Substance Use Topics   Alcohol use: Yes    Alcohol/week: 0.0 standard drinks of alcohol    Comment: occasional wine   Drug use: No     Allergies   Metformin hcl   Review of Systems Review of Systems   Physical Exam Triage Vital Signs ED Triage Vitals  Encounter Vitals Group     BP 04/26/24 1024 (!) 152/93     Girls Systolic BP Percentile --      Girls Diastolic BP Percentile --  Boys Systolic BP Percentile --      Boys Diastolic BP Percentile --      Pulse Rate 04/26/24 1024 73      Resp 04/26/24 1024 20     Temp 04/26/24 1024 98.2 F (36.8 C)     Temp Source 04/26/24 1024 Oral     SpO2 04/26/24 1024 97 %     Weight 04/26/24 1021 224 lb (101.6 kg)     Height 04/26/24 1021 4' 11 (1.499 m)     Head Circumference --      Peak Flow --      Pain Score 04/26/24 1018 0     Pain Loc --      Pain Education --      Exclude from Growth Chart --    No data found.  Updated Vital Signs BP (!) 152/93 (BP Location: Left Arm)   Pulse 73   Temp 98.2 F (36.8 C) (Oral)   Resp 20   Ht 4' 11 (1.499 m)   Wt 101.6 kg   SpO2 97%   BMI 45.24 kg/m   Visual Acuity Right Eye Distance:   Left Eye Distance:   Bilateral Distance:    Right Eye Near:   Left Eye Near:    Bilateral Near:     Physical Exam Vitals reviewed.  Constitutional:      General: She is not in acute distress.    Appearance: She is not toxic-appearing.  HENT:     Right Ear: Tympanic membrane and ear canal normal.     Left Ear: Tympanic membrane and ear canal normal.     Nose: Congestion present.     Mouth/Throat:     Mouth: Mucous membranes are moist.     Comments: There is a good bit of clear mucus draining in the oropharynx Eyes:     Extraocular Movements: Extraocular movements intact.     Conjunctiva/sclera: Conjunctivae normal.     Pupils: Pupils are equal, round, and reactive to light.  Cardiovascular:     Rate and Rhythm: Normal rate and regular rhythm.     Heart sounds: No murmur heard. Pulmonary:     Effort: No respiratory distress.     Breath sounds: No stridor. No rhonchi or rales.     Comments: There are scant expiratory wheezes heard throughout the lung fields.  Air movement is still fairly good Chest:     Chest wall: No tenderness.  Musculoskeletal:     Cervical back: Neck supple.  Lymphadenopathy:     Cervical: No cervical adenopathy.  Skin:    Capillary Refill: Capillary refill takes less than 2 seconds.     Coloration: Skin is not jaundiced or pale.  Neurological:      General: No focal deficit present.     Mental Status: She is alert and oriented to person, place, and time.  Psychiatric:        Behavior: Behavior normal.      UC Treatments / Results  Labs (all labs ordered are listed, but only abnormal results are displayed) Labs Reviewed - No data to display  EKG   Radiology No results found.  Procedures Procedures (including critical care time)  Medications Ordered in UC Medications - No data to display  Initial Impression / Assessment and Plan / UC Course  I have reviewed the triage vital signs and the nursing notes.  Pertinent labs & imaging results that were available during my care of the patient were reviewed by  me and considered in my medical decision making (see chart for details).     Doxycycline  is sent into treat acute sinusitis and albuterol  and 3 days of prednisone  are sent in to treat what appears to be an asthma exacerbation and acute bronchitis.  I have warned her that the prednisone  can elevate her sugars and she should check her sugars and drink plenty of fluids. Final Clinical Impressions(s) / UC Diagnoses   Final diagnoses:  Acute sinusitis, recurrence not specified, unspecified location  Mild intermittent asthma with (acute) exacerbation     Discharge Instructions      Take doxycycline  100 mg --1 capsule 2 times daily for 7 days  Albuterol  inhaler--do 2 puffs every 4 hours as needed for shortness of breath or wheezing  Take prednisone  20 mg--2 daily for 3 days; this medication can make your sugars go higher.  Please drink plenty of fluids and be as good as she can on your diet while taking this medication.  This medication is for inflammation in your lungs.  Take benzonatate  100 mg, 1 tab every 8 hours as needed for cough.  Please follow-up with your primary care     ED Prescriptions     Medication Sig Dispense Auth. Provider   doxycycline  (VIBRAMYCIN ) 100 MG capsule Take 1 capsule (100 mg  total) by mouth 2 (two) times daily for 7 days. 14 capsule Aneli Zara K, MD   albuterol  (VENTOLIN  HFA) 108 (90 Base) MCG/ACT inhaler Inhale 2 puffs into the lungs every 4 (four) hours as needed for wheezing or shortness of breath. 1 each Vonna Sharlet POUR, MD   predniSONE  (DELTASONE ) 20 MG tablet Take 2 tablets (40 mg total) by mouth daily with breakfast for 3 days. 6 tablet Sherald Balbuena, Sharlet POUR, MD   benzonatate  (TESSALON ) 100 MG capsule Take 1 capsule (100 mg total) by mouth 3 (three) times daily as needed for cough. 21 capsule Feliza Diven K, MD      PDMP not reviewed this encounter.   Vonna Sharlet POUR, MD 04/26/24 1045

## 2024-04-26 NOTE — ED Triage Notes (Signed)
 I think I have a sinus infection, I have had sinus problems for a while now (2-3 wks), now a productive cough with it, no fever. No wheezing/sob (known).

## 2024-04-26 NOTE — Discharge Instructions (Signed)
 Take doxycycline  100 mg --1 capsule 2 times daily for 7 days  Albuterol  inhaler--do 2 puffs every 4 hours as needed for shortness of breath or wheezing  Take prednisone  20 mg--2 daily for 3 days; this medication can make your sugars go higher.  Please drink plenty of fluids and be as good as she can on your diet while taking this medication.  This medication is for inflammation in your lungs.  Take benzonatate  100 mg, 1 tab every 8 hours as needed for cough.  Please follow-up with your primary care

## 2024-07-09 ENCOUNTER — Ambulatory Visit
Admission: EM | Admit: 2024-07-09 | Discharge: 2024-07-09 | Disposition: A | Discharge status: Home / Self Care | Attending: Family Medicine | Admitting: Family Medicine

## 2024-07-09 DIAGNOSIS — S61210A Laceration without foreign body of right index finger without damage to nail, initial encounter: Secondary | ICD-10-CM

## 2024-07-09 MED ORDER — TETANUS-DIPHTH-ACELL PERTUSSIS 5-2.5-18.5 LF-MCG/0.5 IM SUSY
0.5000 mL | PREFILLED_SYRINGE | Freq: Once | INTRAMUSCULAR | Status: AC
  Administered 2024-07-09: 0.5 mL via INTRAMUSCULAR

## 2024-07-09 NOTE — ED Triage Notes (Signed)
 Patient reports cutting her right index finger (really bad) while washing dishes (glass had busted while washing it). On the way here went to the [facility] [employer] and had it wrapped due to the bleeding non stop. Last Tdap: > 5 yrs (or unknown).

## 2024-07-09 NOTE — ED Provider Notes (Signed)
 [facility]   [id number] 07/09/24 Arrival Time: 1724  ASSESSMENT & PLAN:  1. Laceration of right index finger without foreign body without damage to nail, initial encounter     Procedure: Laceration Repair Verbal consent obtained. Patient provided with risks and alternatives to the procedure. Wound copiously irrigated with NS then cleansed with betadine. Local anesthesia: Lidocaine  2% without epinephrine. Finger tourniquet applied. Wound carefully explored. No foreign body, tendon injury, or nonviable tissue were noted. Using clean technique, 3 interrupted and 1 modified corner 4-0 Prolene sutures were placed to reapproximate the wound. Procedure tolerated well. No complications. Minimal bleeding. Advised to look for and return for any signs of infection such as redness, swelling, discharge, or worsening pain. Return for suture removal in 7 days.  Finger splinted and dressed prior to discharge.  Meds ordered this encounter  Medications   Tdap (BOOSTRIX) injection 0.5 mL   Reviewed expectations re: course of current medical issues. Questions answered. Outlined signs and symptoms indicating need for more acute intervention. Patient verbalized understanding. After Visit Summary given.   SUBJECTIVE:  [name] is a 78 y.o. 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 Denies extremity sensation changes or weakness.   Td UTD: Unknown.   OBJECTIVE:  Vitals:   07/09/24 1735 07/09/24 1737 07/09/24 1740  BP:  (!) 157/90   Pulse:  74   Resp:  20   Temp:  99.5 F (37.5 C)   TempSrc:  Oral   SpO2:  94% 95%  Weight: 102.1 kg    Height: 4' 11 (1.499 m)       General appearance: alert; no distress RUE: v-shaped laceration of right second finger at PIP; size: approx 2 cm; clean wound edges, no foreign bodies; with mild active bleeding; normal distal sensation and cap  refill Psychological: alert and cooperative; normal mood and affect    Allergies  Allergen Reactions   Metformin Hcl     Other Reaction(s): weak    Past Medical History:  Diagnosis Date   Anemia    Anxiety    Arthritis    Complication of anesthesia years ago   jittery and nervous after 1 d and c   Diabetes mellitus    type 2   Esophageal stricture    GERD (gastroesophageal reflux disease)    Heart palpitations    heart palpitaions last 2 weeks, family md  dr polite aware no ekg done   Hypertension    Sleep apnea    does not tolerate cpap   UTI (urinary tract infection)    Social History   Socioeconomic History   Marital status: Married    Spouse name: Not on file   Number of children: 3   Years of education: Not on file   Highest education level: Not on file  Occupational History   Occupation: Retired  Tobacco Use   Smoking status: Never    Passive exposure: Never   Smokeless tobacco: Never  Vaping Use   Vaping status: Never Used  Substance and Sexual Activity   Alcohol use: Yes    Alcohol/week: 0.0 standard drinks of alcohol    Comment: occasional wine   Drug use: No   Sexual activity: Not Currently  Other Topics Concern   Not on file  Social History Narrative   Not on file   Social Drivers of Health   Financial Resource Strain:  Not on file  Food Insecurity: No Food Insecurity (11/23/2023)   Hunger Vital Sign    Worried About Running Out of Food in the Last Year: Never true    Ran Out of Food in the Last Year: Never true  Transportation Needs: No Transportation Needs (07/18/2023)   Received from [facility]    In the past 12 months, has lack of reliable transportation kept you from medical appointments, meetings, work or from getting things needed for daily living? : No  Physical Activity: Not on file  Stress: Not on file  Social Connections: Unknown (03/07/2022)   Received from [facility]   Social Network    Social Network:  Not on file          [place], Redell, MD 07/09/24 1851

## 2024-07-18 ENCOUNTER — Ambulatory Visit
Admission: EM | Admit: 2024-07-18 | Discharge: 2024-07-18 | Disposition: A | Discharge status: Home / Self Care | Source: Ambulatory Visit

## 2024-07-18 DIAGNOSIS — Z4802 Encounter for removal of sutures: Secondary | ICD-10-CM | POA: Diagnosis not present

## 2024-07-18 NOTE — ED Triage Notes (Addendum)
 Pt is here to get suture removed from right index finger from cut on 07/09/2024. Pt had 5 suture removed.

## 2024-08-07 DIAGNOSIS — E1159 Type 2 diabetes mellitus with other circulatory complications: Secondary | ICD-10-CM | POA: Diagnosis not present

## 2024-08-07 DIAGNOSIS — K8681 Exocrine pancreatic insufficiency: Secondary | ICD-10-CM | POA: Diagnosis not present

## 2024-08-07 DIAGNOSIS — Z1159 Encounter for screening for other viral diseases: Secondary | ICD-10-CM | POA: Diagnosis not present

## 2024-08-07 DIAGNOSIS — Z79899 Other long term (current) drug therapy: Secondary | ICD-10-CM | POA: Diagnosis not present

## 2024-08-07 DIAGNOSIS — Z0189 Encounter for other specified special examinations: Secondary | ICD-10-CM | POA: Diagnosis not present

## 2024-08-07 DIAGNOSIS — R5383 Other fatigue: Secondary | ICD-10-CM | POA: Diagnosis not present

## 2024-08-20 ENCOUNTER — Other Ambulatory Visit: Payer: Self-pay

## 2024-08-20 DIAGNOSIS — M79651 Pain in right thigh: Secondary | ICD-10-CM

## 2024-08-26 ENCOUNTER — Inpatient Hospital Stay: Admission: RE | Admit: 2024-08-26 | Discharge: 2024-08-26

## 2024-08-26 DIAGNOSIS — M79651 Pain in right thigh: Secondary | ICD-10-CM

## 2024-11-04 ENCOUNTER — Ambulatory Visit: Admitting: Adult Health

## 2024-11-04 ENCOUNTER — Telehealth: Payer: Self-pay

## 2024-11-04 ENCOUNTER — Encounter: Payer: Self-pay | Admitting: Adult Health

## 2024-11-04 VITALS — BP 133/88 | HR 76 | Temp 97.4°F | Ht 59.0 in | Wt 219.0 lb

## 2024-11-04 DIAGNOSIS — I10 Essential (primary) hypertension: Secondary | ICD-10-CM | POA: Diagnosis not present

## 2024-11-04 DIAGNOSIS — E119 Type 2 diabetes mellitus without complications: Secondary | ICD-10-CM | POA: Diagnosis not present

## 2024-11-04 DIAGNOSIS — G4733 Obstructive sleep apnea (adult) (pediatric): Secondary | ICD-10-CM | POA: Diagnosis not present

## 2024-11-04 DIAGNOSIS — Z87891 Personal history of nicotine dependence: Secondary | ICD-10-CM | POA: Diagnosis not present

## 2024-11-04 NOTE — Telephone Encounter (Signed)
 Faxed medical records request to [facility] at [facility] at [phone] for a copy of pt's most recent sleep study.   I provided the following fax number to send records to: [phone]  Fax confirmation received, [facility].

## 2024-11-04 NOTE — Patient Instructions (Addendum)
 Set up for in lab sleep study  Work on healthy weight loss  Do not drive if sleepy  Healthy sleep regimen  Activity as tolerated.  Follow up in 6-8 weeks to review results and treatment.

## 2024-11-04 NOTE — Progress Notes (Signed)
 "  @Patient  ID: [name], female    DOB: May 05, 1946, 79 y.o.   MRN: [id number]  Chief Complaint  Patient presents with   Consult    SLEEP    Referring provider: Rexanne Ingle, MD  HPI: 79 yo seen for sleep consult 11/05/23 for sleep apnea  Diagnosed with Sleep apnea years ago, CPAP intolerant     TEST/EVENTS : Reviewed 11/04/2024  Discussed the use of AI scribe software for clinical note transcription with the patient, who gave verbal consent to proceed.  History of Present Illness [name] is a 79 year old female with sleep apnea who presents for sleep consult for worsening daytime sleepiness and headaches.  She has a history of sleep apnea, initially diagnosed several years ago following sleep studies conducted both in a sleep lab and at home.  Unsure of the severity of her sleep apnea.  Records have been requested.  Symptoms prior to diagnosis included snoring, restless sleep, and daytime sleepiness. She was prescribed CPAP therapy but found the full face mask uncomfortable, leading to inconsistent use. Recently, she experiences increased fatigue, waking up with headaches, and 'brain fog', describing her daytime sleepiness as feeling like a 'zombie'.  Her medical history includes diabetes, managed with insulin and Trulicity, and hypertension, for which she takes medication. She previously used Mounjaro for diabetes management but discontinued it due to lack of efficacy.  She has pancreatic insufficiency.[name] Despite efforts to manage her weight, she reports difficulty losing weight, which she attributes to her medication regimen and ongoing health issues.  Her sleep disturbances include difficulty falling asleep, sometimes not sleeping until early morning, and waking up feeling unrested. No use of sleep aids or experiencing teeth grinding. She occasionally consumes caffeine, typically one cup of coffee in the morning, and drinks wine infrequently. She does not smoke or use  drugs.  No symptoms suspicious for cataplexy or sleep paralysis  She is married and has adult children. She wants to address her sleep issues due to the significant impact on her daily life, including worsening headaches and gastrointestinal discomfort when sleep-deprived.  Current weight is 219 pounds with a BMI of 44   Allergies[1]  Immunization History  Administered Date(s) Administered   Tdap 07/09/2024    Past Medical History:  Diagnosis Date   Anemia    Anxiety    Arthritis    Complication of anesthesia years ago   jittery and nervous after 1 d and c   Diabetes mellitus    type 2   Esophageal stricture    GERD (gastroesophageal reflux disease)    Heart palpitations    heart palpitaions last 2 weeks, family md  dr polite aware no ekg done   Hypertension    Sleep apnea    does not tolerate cpap   UTI (urinary tract infection)     Tobacco History: Tobacco Use History[2] Counseling given: Not Answered Tobacco comments: Smoked for 1-2 years, quit years ago   Outpatient Medications Prior to Visit  Medication Sig Dispense Refill   ACCU-CHEK SMARTVIEW test strip      Accu-Chek Softclix Lancets lancets USE 1 TO CHECK GLUCOSE TWICE DAILY; Duration: 90 days     AIMSCO INSULIN SYR ULTRA THIN 31G X 5/16 0.3 ML MISC      B-D ULTRAFINE III SHORT PEN 31G X 8 MM MISC Inject into the skin daily.     Blood Glucose Monitoring Suppl (ACCU-CHEK NANO SMARTVIEW) W/DEVICE KIT      cholecalciferol (VITAMIN  D3) 25 MCG (1000 UNIT) tablet Take 1,000 Units by mouth daily.     Continuous Glucose Receiver (FREESTYLE LIBRE 3 READER) DEVI use to monitor blood sugar daily as directed     Continuous Glucose Sensor (FREESTYLE LIBRE 3 SENSOR) MISC apply to upper arm every 14 days; Duration: 90 days     CREON 36000-114000 units CPEP capsule Take 36,000 Units by mouth 3 (three) times daily before meals.     FLUoxetine HCl 60 MG TABS Take 1 tablet by mouth daily.     furosemide (LASIX) 20 MG  tablet Take 20 mg by mouth daily.     insulin glargine (LANTUS) 100 UNIT/ML injection Inject 40 Units into the skin every morning.      Lancet Devices (SIMPLE DIAGNOSTICS LANCING DEV) MISC      LANTUS SOLOSTAR 100 UNIT/ML Solostar Pen Inject 40 Units into the skin daily.     losartan (COZAAR) 100 MG tablet Take 100 mg by mouth daily.     metoprolol (LOPRESSOR) 50 MG tablet Take 50 mg by mouth every morning.      omeprazole (PRILOSEC) 20 MG capsule Take 20 mg by mouth daily.     Pancrelipase, Lip-Prot-Amyl, (CREON PO) Take by mouth. TID     pantoprazole (PROTONIX) 40 MG tablet Take 40 mg by mouth 2 (two) times daily.     potassium chloride  SA (KLOR-CON ) 20 MEQ tablet Take 1 tablet (20 mEq total) by mouth 2 (two) times daily. 20 tablet 0   Probiotic Product (ALIGN) 10 MG CAPS Take 4 mg by mouth daily.     Semaglutide (RYBELSUS) 3 MG TABS Take 1 tablet by mouth daily.     sitaGLIPtin (JANUVIA) 100 MG tablet Take 100 mg by mouth every morning.     TRULICITY 3 MG/0.5ML SOAJ Inject 3 mg into the skin once a week.     albuterol  (VENTOLIN  HFA) 108 (90 Base) MCG/ACT inhaler Inhale 2 puffs into the lungs every 4 (four) hours as needed for wheezing or shortness of breath. (Patient not taking: Reported on 11/04/2024) 1 each 0   baclofen  5 MG TABS Take 5 mg by mouth 3 (three) times daily as needed for muscle spasms. (Patient not taking: Reported on 11/04/2024) 30 tablet 0   benzonatate  (TESSALON ) 100 MG capsule Take 1 capsule (100 mg total) by mouth 3 (three) times daily as needed for cough. (Patient not taking: Reported on 11/04/2024) 21 capsule 0   cyclobenzaprine (FLEXERIL) 5 MG tablet Take 5 mg by mouth. (Patient not taking: Reported on 11/04/2024)     cyclobenzaprine (FLEXERIL) 5 MG tablet Take 5 mg by mouth at bedtime as needed. (Patient not taking: Reported on 11/04/2024)     dextromethorphan-guaiFENesin (MUCINEX DM) 30-600 MG 12hr tablet Take 1 tablet by mouth 2 (two) times daily. (Patient not taking:  Reported on 11/04/2024)     ergocalciferol (VITAMIN D2) 1.25 MG (50000 UT) capsule Take 50,000 Units by mouth once a week. (Patient not taking: Reported on 11/04/2024)     FLUoxetine (PROZAC) 40 MG capsule Take 40 mg by mouth every morning.  (Patient not taking: Reported on 11/04/2024)     FLUoxetine (PROZAC) 40 MG capsule Take 40 mg by mouth daily. (Patient not taking: Reported on 11/04/2024)     fluticasone  (FLONASE ) 50 MCG/ACT nasal spray Place 1 spray into both nostrils daily. (Patient not taking: Reported on 11/04/2024) 16 g 0   fluticasone  (FLONASE ) 50 MCG/ACT nasal spray Place 1 spray into both nostrils daily. (Patient not  taking: Reported on 11/04/2024)     furosemide (LASIX) 20 MG tablet Take 20 mg by mouth daily. (Patient not taking: Reported on 11/04/2024)     lidocaine  (XYLOCAINE ) 2 % solution Use as directed 10 mLs in the mouth or throat as needed for mouth pain. (Patient not taking: Reported on 11/04/2024) 100 mL 0   losartan (COZAAR) 25 MG tablet  (Patient not taking: Reported on 11/04/2024)     losartan (COZAAR) 50 MG tablet Take 50 mg by mouth daily. (Patient not taking: Reported on 11/04/2024)     meloxicam  (MOBIC ) 15 MG tablet Take 1 tablet daily with food for 12 days. Then take as needed. (Patient not taking: Reported on 11/04/2024) 12 tablet 0   naproxen (NAPROSYN) 500 MG tablet Take 500 mg by mouth 2 (two) times daily with a meal. (Patient not taking: Reported on 11/04/2024)     omeprazole (PRILOSEC) 20 MG capsule  (Patient not taking: Reported on 11/04/2024)     Probiotic TBEC as directed Orally (Patient not taking: Reported on 11/04/2024)     rosuvastatin (CRESTOR) 10 MG tablet Take 10 mg by mouth at bedtime. (Patient not taking: Reported on 11/04/2024)     silver  sulfADIAZINE  (SILVADENE ) 1 % cream Apply 1 application topically daily. (Patient not taking: Reported on 11/04/2024) 50 g 0   tirzepatide (MOUNJARO) 5 MG/0.5ML Pen Inject 5 mg into the skin once a week. (Patient not taking: Reported  on 11/04/2024)     tirzepatide (MOUNJARO) 7.5 MG/0.5ML Pen Inject 7.5 mg into the skin once a week. (Patient not taking: Reported on 11/04/2024)     Turmeric 500 MG CAPS as directed Orally (Patient not taking: Reported on 11/04/2024)     Vitamin D, Ergocalciferol, (DRISDOL) 1.25 MG (50000 UNIT) CAPS capsule Take 1 capsule by mouth once a week. (Patient not taking: Reported on 11/04/2024)     No facility-administered medications prior to visit.     Review of Systems:   Constitutional:   No  weight loss, night sweats,  Fevers, chills, +fatigue, or  lassitude.  HEENT:   No headaches,  Difficulty swallowing,  Tooth/dental problems, or  Sore throat,                No sneezing, itching, ear ache, nasal congestion, post nasal drip,   CV:  No chest pain,  Orthopnea, PND, swelling in lower extremities, anasarca, dizziness, palpitations, syncope.   GI  No heartburn, indigestion, abdominal pain, nausea, vomiting, diarrhea, change in bowel habits, loss of appetite, bloody stools.   Resp: No shortness of breath with exertion or at rest.  No excess mucus, no productive cough,  No non-productive cough,  No coughing up of blood.  No change in color of mucus.  No wheezing.  No chest wall deformity  Skin: no rash or lesions.  GU: no dysuria, change in color of urine, no urgency or frequency.  No flank pain, no hematuria   MS:  No joint pain or swelling.  No decreased range of motion.  No back pain.    Physical Exam  BP 133/88   Pulse 76   Temp (!) 97.4 F (36.3 C)   Ht 4' 11 (1.499 m) Comment: Per pt  Wt 219 lb (99.3 kg)   SpO2 93% Comment: RA  BMI 44.23 kg/m   GEN: A/Ox3; pleasant , NAD, well nourished    HEENT:  [place]/AT,  EACs-clear, TMs-wnl, NOSE-clear, THROAT-clear, no lesions, no postnasal drip or exudate noted.  Class III MP airway  NECK:  Supple w/ fair ROM; no JVD; normal carotid impulses w/o bruits; no thyromegaly or nodules palpated; no lymphadenopathy.    RESP  Clear  P & A; w/o,  wheezes/ rales/ or rhonchi. no accessory muscle use, no dullness to percussion  CARD:  RRR, no m/r/g, no peripheral edema, pulses intact, no cyanosis or clubbing.  GI:   Soft & nt; nml bowel sounds; no organomegaly or masses detected.   Musco: Warm bil, no deformities or joint swelling noted.   Neuro: alert, no focal deficits noted.    Skin: Warm, no lesions or rashes    Lab Results:Reviewed 11/04/2024   CBC    ProBNP No results found for: PROBNP  Imaging: No results found.  Administration History     None           No data to display          No results found for: NITRICOXIDE     11/04/2024   10:00 AM  Results of the [facility] flowsheet  Sitting and reading 2  Watching TV 2  Sitting, inactive in a public place (e.g. a theatre or a meeting) 0  As a passenger in a car for an hour without a break 2  Lying down to rest in the afternoon when circumstances permit 2  Sitting and talking to someone 0  Sitting quietly after a lunch without alcohol 2  In a car, while stopped for a few minutes in traffic 0  Total score 10        Assessment & Plan:   Assessment and Plan Assessment & Plan Obstructive Sleep Apnea  -with significant symptom burden Chronic obstructive sleep apnea presents with daytime sleepiness, morning headaches, and cognitive impairment. Previous CPAP therapy was stopped due to discomfort with a full face mask, leading to significant fatigue and sleep disturbances. Discussed resuming CPAP therapy with a smaller nasal mask and potential surgical intervention with the Inspire device if BMI is reduced below 40. [name] Explained that untreated sleep apnea can decrease oxygen levels, affecting the brain and heart, potentially causing hypertension, diabetes, heart disease, congestive heart failure, arrhythmias, cognitive impairment, and mood disorders. A sleep study is needed to reassess severity and determine treatment options.  Previously did a home sleep  study that she felt was not representative of her sleep and would like to do an in-lab study if possible.. Order an in-lab sleep study pending insurance approval.  Initiate CPAP therapy with a small nasal mask if sleep apnea is confirmed. Discuss weight loss strategies to potentially qualify for the Inspire device if CPAP is not tolerated.  Type 2 Diabetes Mellitus  -managed by primary care.  Hypertension  -controlled on medication Hypertension is managed with medication.  Follow-up   Plan to review sleep study results and discuss further management options for sleep apnea and weight loss. Schedule a follow-up appointment in 6-8 weeks to review sleep study results and discuss the treatment plan.        Shernell Saldierna, NP 11/04/2024  I spent  34 minutes dedicated to the care of this patient on the date of this encounter to include pre-visit review of records, face-to-face time with the patient discussing conditions above, post visit ordering of testing, clinical documentation with the electronic health record, making appropriate referrals as documented, and communicating necessary findings to members of the patients care team.      [1]  Allergies Allergen Reactions   Metformin Hcl     Other Reaction(s): weak  [  2]  Social History Tobacco Use  Smoking Status Former   Types: Cigarettes   Passive exposure: Never  Smokeless Tobacco Never  Tobacco Comments   Smoked for 1-2 years, quit years ago   "

## 2024-11-05 NOTE — Telephone Encounter (Signed)
 Received sleep study via fax from [facility]. Made one copy. The original was placed into the scan folder along with the pt's name, DOB, and MRN. The copy was placed into Tammy's review folder in [facility]. [facility]

## 2024-12-18 ENCOUNTER — Ambulatory Visit: Admitting: Adult Health

## 2025-01-04 ENCOUNTER — Ambulatory Visit (HOSPITAL_BASED_OUTPATIENT_CLINIC_OR_DEPARTMENT_OTHER): Admitting: Pulmonary Disease

## 2025-01-15 ENCOUNTER — Ambulatory Visit: Admitting: Adult Health
